# Patient Record
Sex: Female | Born: 1950 | Race: White | Hispanic: No | Marital: Married | Smoking: Never smoker
Health system: Southern US, Community
[De-identification: ages and names within clinical notes are randomized; demographics above are authoritative.]

## PROBLEM LIST (undated history)

## (undated) DIAGNOSIS — Z86010 Personal history of colonic polyps: Secondary | ICD-10-CM

## (undated) DIAGNOSIS — B009 Herpesviral infection, unspecified: Secondary | ICD-10-CM

## (undated) DIAGNOSIS — L570 Actinic keratosis: Secondary | ICD-10-CM

## (undated) DIAGNOSIS — F329 Major depressive disorder, single episode, unspecified: Secondary | ICD-10-CM

## (undated) DIAGNOSIS — M8580 Other specified disorders of bone density and structure, unspecified site: Secondary | ICD-10-CM

## (undated) HISTORY — PX: TUBAL LIGATION: SHX77

## (undated) HISTORY — DX: Herpesviral infection, unspecified: B00.9

## (undated) HISTORY — PX: NECK SURGERY: SHX720

## (undated) HISTORY — DX: Other specified disorders of bone density and structure, unspecified site: M85.80

## (undated) HISTORY — PX: TONSILLECTOMY AND ADENOIDECTOMY: SUR1326

## (undated) HISTORY — DX: Major depressive disorder, single episode, unspecified: F32.9

## (undated) HISTORY — DX: Personal history of colonic polyps: Z86.010

## (undated) HISTORY — PX: ECTOPIC PREGNANCY SURGERY: SHX613

## (undated) HISTORY — DX: Actinic keratosis: L57.0

---

## 1998-02-18 ENCOUNTER — Ambulatory Visit (HOSPITAL_COMMUNITY): Admission: RE | Admit: 1998-02-18 | Discharge: 1998-02-18 | Payer: Self-pay | Admitting: *Deleted

## 1998-11-10 ENCOUNTER — Other Ambulatory Visit: Admission: RE | Admit: 1998-11-10 | Discharge: 1998-11-10 | Payer: Self-pay | Admitting: Gynecology

## 1999-05-17 ENCOUNTER — Ambulatory Visit (HOSPITAL_COMMUNITY): Admission: RE | Admit: 1999-05-17 | Discharge: 1999-05-17 | Payer: Self-pay | Admitting: Gynecology

## 1999-05-17 ENCOUNTER — Encounter: Payer: Self-pay | Admitting: Gynecology

## 1999-07-15 ENCOUNTER — Ambulatory Visit (HOSPITAL_BASED_OUTPATIENT_CLINIC_OR_DEPARTMENT_OTHER): Admission: RE | Admit: 1999-07-15 | Discharge: 1999-07-15 | Payer: Self-pay | Admitting: Plastic Surgery

## 1999-08-05 ENCOUNTER — Ambulatory Visit (HOSPITAL_BASED_OUTPATIENT_CLINIC_OR_DEPARTMENT_OTHER): Admission: RE | Admit: 1999-08-05 | Discharge: 1999-08-05 | Payer: Self-pay | Admitting: Plastic Surgery

## 1999-11-29 ENCOUNTER — Other Ambulatory Visit: Admission: RE | Admit: 1999-11-29 | Discharge: 1999-11-29 | Payer: Self-pay | Admitting: Gynecology

## 2000-07-31 ENCOUNTER — Ambulatory Visit (HOSPITAL_COMMUNITY): Admission: RE | Admit: 2000-07-31 | Discharge: 2000-07-31 | Payer: Self-pay | Admitting: Gynecology

## 2000-07-31 ENCOUNTER — Encounter: Payer: Self-pay | Admitting: Gynecology

## 2001-06-14 ENCOUNTER — Other Ambulatory Visit: Admission: RE | Admit: 2001-06-14 | Discharge: 2001-06-14 | Payer: Self-pay | Admitting: Gynecology

## 2002-04-29 ENCOUNTER — Ambulatory Visit (HOSPITAL_COMMUNITY): Admission: RE | Admit: 2002-04-29 | Discharge: 2002-04-29 | Payer: Self-pay | Admitting: Gynecology

## 2002-04-29 ENCOUNTER — Encounter: Payer: Self-pay | Admitting: Gynecology

## 2002-06-27 ENCOUNTER — Other Ambulatory Visit: Admission: RE | Admit: 2002-06-27 | Discharge: 2002-06-27 | Payer: Self-pay | Admitting: Gynecology

## 2003-08-25 ENCOUNTER — Ambulatory Visit (HOSPITAL_COMMUNITY): Admission: RE | Admit: 2003-08-25 | Discharge: 2003-08-25 | Payer: Self-pay | Admitting: Gynecology

## 2003-09-07 ENCOUNTER — Other Ambulatory Visit: Admission: RE | Admit: 2003-09-07 | Discharge: 2003-09-07 | Payer: Self-pay | Admitting: Gynecology

## 2004-09-09 ENCOUNTER — Other Ambulatory Visit: Admission: RE | Admit: 2004-09-09 | Discharge: 2004-09-09 | Payer: Self-pay | Admitting: Gynecology

## 2004-11-04 ENCOUNTER — Ambulatory Visit (HOSPITAL_COMMUNITY): Admission: RE | Admit: 2004-11-04 | Discharge: 2004-11-04 | Payer: Self-pay | Admitting: Gynecology

## 2004-11-11 ENCOUNTER — Encounter: Admission: RE | Admit: 2004-11-11 | Discharge: 2004-11-11 | Payer: Self-pay | Admitting: Gynecology

## 2005-01-31 ENCOUNTER — Ambulatory Visit: Payer: Self-pay | Admitting: Internal Medicine

## 2005-04-20 ENCOUNTER — Ambulatory Visit: Payer: Self-pay | Admitting: Internal Medicine

## 2005-05-01 ENCOUNTER — Ambulatory Visit: Payer: Self-pay | Admitting: Internal Medicine

## 2005-05-01 LAB — PULMONARY FUNCTION TEST

## 2005-08-03 ENCOUNTER — Ambulatory Visit: Payer: Self-pay | Admitting: Internal Medicine

## 2005-08-09 ENCOUNTER — Ambulatory Visit: Payer: Self-pay | Admitting: Internal Medicine

## 2005-09-11 ENCOUNTER — Other Ambulatory Visit: Admission: RE | Admit: 2005-09-11 | Discharge: 2005-09-11 | Payer: Self-pay | Admitting: Gynecology

## 2005-11-17 ENCOUNTER — Ambulatory Visit (HOSPITAL_COMMUNITY): Admission: RE | Admit: 2005-11-17 | Discharge: 2005-11-17 | Payer: Self-pay | Admitting: Internal Medicine

## 2006-10-10 ENCOUNTER — Other Ambulatory Visit: Admission: RE | Admit: 2006-10-10 | Discharge: 2006-10-10 | Payer: Self-pay | Admitting: Gynecology

## 2007-01-03 ENCOUNTER — Ambulatory Visit (HOSPITAL_COMMUNITY): Admission: RE | Admit: 2007-01-03 | Discharge: 2007-01-03 | Payer: Self-pay | Admitting: Gynecology

## 2007-02-20 ENCOUNTER — Ambulatory Visit: Payer: Self-pay | Admitting: Internal Medicine

## 2007-06-27 DIAGNOSIS — Z860100 Personal history of colon polyps, unspecified: Secondary | ICD-10-CM

## 2007-06-27 HISTORY — DX: Personal history of colonic polyps: Z86.010

## 2007-06-27 HISTORY — DX: Personal history of colon polyps, unspecified: Z86.0100

## 2007-10-17 ENCOUNTER — Other Ambulatory Visit: Admission: RE | Admit: 2007-10-17 | Discharge: 2007-10-17 | Payer: Self-pay | Admitting: Gynecology

## 2008-06-17 ENCOUNTER — Ambulatory Visit (HOSPITAL_COMMUNITY): Admission: RE | Admit: 2008-06-17 | Discharge: 2008-06-17 | Payer: Self-pay | Admitting: Gynecology

## 2008-11-17 ENCOUNTER — Other Ambulatory Visit: Admission: RE | Admit: 2008-11-17 | Discharge: 2008-11-17 | Payer: Self-pay | Admitting: Gynecology

## 2008-11-17 ENCOUNTER — Ambulatory Visit: Payer: Self-pay | Admitting: Gynecology

## 2008-11-17 ENCOUNTER — Encounter: Payer: Self-pay | Admitting: Gynecology

## 2008-12-01 ENCOUNTER — Ambulatory Visit: Payer: Self-pay | Admitting: Gynecology

## 2010-01-21 ENCOUNTER — Encounter: Admission: RE | Admit: 2010-01-21 | Discharge: 2010-01-21 | Payer: Self-pay | Admitting: Gynecology

## 2010-01-25 ENCOUNTER — Ambulatory Visit: Payer: Self-pay | Admitting: Gynecology

## 2010-01-25 ENCOUNTER — Other Ambulatory Visit: Admission: RE | Admit: 2010-01-25 | Discharge: 2010-01-25 | Payer: Self-pay | Admitting: Gynecology

## 2010-10-16 ENCOUNTER — Encounter: Payer: Self-pay | Admitting: Gynecology

## 2011-06-26 ENCOUNTER — Other Ambulatory Visit: Payer: Self-pay | Admitting: Gynecology

## 2011-06-26 DIAGNOSIS — Z1231 Encounter for screening mammogram for malignant neoplasm of breast: Secondary | ICD-10-CM

## 2011-07-11 ENCOUNTER — Ambulatory Visit
Admission: RE | Admit: 2011-07-11 | Discharge: 2011-07-11 | Disposition: A | Discharge status: Home / Self Care | Payer: BC Managed Care – PPO | Source: Ambulatory Visit | Attending: Gynecology | Admitting: Gynecology

## 2011-07-11 DIAGNOSIS — Z1231 Encounter for screening mammogram for malignant neoplasm of breast: Secondary | ICD-10-CM

## 2011-07-13 ENCOUNTER — Encounter: Payer: Self-pay | Admitting: Gynecology

## 2011-07-13 DIAGNOSIS — M8580 Other specified disorders of bone density and structure, unspecified site: Secondary | ICD-10-CM | POA: Insufficient documentation

## 2011-07-17 ENCOUNTER — Encounter: Payer: Self-pay | Admitting: Gynecology

## 2011-07-17 ENCOUNTER — Ambulatory Visit (INDEPENDENT_AMBULATORY_CARE_PROVIDER_SITE_OTHER): Payer: BC Managed Care – PPO | Admitting: Gynecology

## 2011-07-17 VITALS — BP 120/64 | Ht 63.0 in | Wt 151.0 lb

## 2011-07-17 DIAGNOSIS — Z131 Encounter for screening for diabetes mellitus: Secondary | ICD-10-CM

## 2011-07-17 DIAGNOSIS — M899 Disorder of bone, unspecified: Secondary | ICD-10-CM

## 2011-07-17 DIAGNOSIS — Z01419 Encounter for gynecological examination (general) (routine) without abnormal findings: Secondary | ICD-10-CM

## 2011-07-17 DIAGNOSIS — M8580 Other specified disorders of bone density and structure, unspecified site: Secondary | ICD-10-CM

## 2011-07-17 DIAGNOSIS — Z79890 Hormone replacement therapy (postmenopausal): Secondary | ICD-10-CM

## 2011-07-17 DIAGNOSIS — Z13220 Encounter for screening for lipoid disorders: Secondary | ICD-10-CM

## 2011-07-17 DIAGNOSIS — R82998 Other abnormal findings in urine: Secondary | ICD-10-CM

## 2011-07-17 MED ORDER — ESTRADIOL-NORETHINDRONE ACET 0.05-0.14 MG/DAY TD PTTW
1.0000 | MEDICATED_PATCH | TRANSDERMAL | Status: DC
Start: 2011-07-17 — End: 2013-09-08

## 2011-07-17 NOTE — Progress Notes (Signed)
Abigail Austin 28-Aug-1951 [id number]        60 y.o.  for annual exam.  Overall doing well. Had been on HRT of Elestrin pump and Prometrium 100 mg, she ran out about 6 months ago and stopped it but notes hot flushes and just not feeling as well.  Past medical history,surgical history, medications, allergies, family history and social history were all reviewed and documented in the EPIC chart. ROS:  Was performed and pertinent positives and negatives are included in the history.  Exam: chaperone present Filed Vitals:   07/17/11 1417  BP: 120/64   General appearance  Normal Skin grossly normal Head/Neck normal with no cervical or supraclavicular adenopathy thyroid normal Lungs  clear Cardiac RR, without RMG Abdominal  soft, nontender, without masses, organomegaly or hernia Breasts  examined lying and sitting without masses, retractions, discharge or axillary adenopathy. Pelvic  Ext/BUS/vagina  normal   Cervix  normal    Uterus  anteverted, normal size, shape and contour, midline and mobile nontender   Adnexa  Without masses or tenderness    Anus and perineum  normal   Rectovaginal  normal sphincter tone without palpated masses or tenderness.    Assessment/Plan:  60 y.o. female for annual exam.    1. HRT. I reviewed the issues of HRT risks benefits, WHI study, ACOG and NAMS recommendations for lowest dose for shortest period of time. Risk of stroke heart attack DVT as well as rest risk for breast cancer was reviewed. 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 She wants to try something different. I discussed the transdermal advantage as far as decreased thrombosis risk and prescribed CombiPatch plan L5-0.14 times a year give her 2 week sample. Assuming she does well with this then she'll continue her for any issue she will call me. 2. Osteopenia DEXA March 2010 showed a T score of -1.5. She's due for repeat now will schedule  this and she'll follow up for this. 3. Health maintenance. Self breast exams on a monthly basis discussed urged. Just had her mammography this month we'll continue with annual mammography. Colonoscopy 5 years ago. Baseline CBC glucose lipid profile urinalysis and vitamin D level ordered. Assuming she continues well from a gynecologic standpoint and she'll see me in a year sooner as needed.    Dara Lords MD, 3:24 PM 07/17/2011

## 2011-08-08 ENCOUNTER — Ambulatory Visit (INDEPENDENT_AMBULATORY_CARE_PROVIDER_SITE_OTHER): Payer: BC Managed Care – PPO

## 2011-08-08 DIAGNOSIS — M8580 Other specified disorders of bone density and structure, unspecified site: Secondary | ICD-10-CM

## 2011-08-08 DIAGNOSIS — M899 Disorder of bone, unspecified: Secondary | ICD-10-CM

## 2011-08-09 ENCOUNTER — Encounter: Payer: Self-pay | Admitting: Gynecology

## 2012-01-02 ENCOUNTER — Other Ambulatory Visit: Payer: Self-pay | Admitting: *Deleted

## 2012-01-02 DIAGNOSIS — E559 Vitamin D deficiency, unspecified: Secondary | ICD-10-CM

## 2012-09-25 LAB — HM PAP SMEAR

## 2012-09-25 LAB — HM COLONOSCOPY

## 2012-09-25 LAB — HM MAMMOGRAPHY

## 2012-11-27 ENCOUNTER — Other Ambulatory Visit: Payer: Self-pay | Admitting: Gynecology

## 2012-11-27 DIAGNOSIS — Z1231 Encounter for screening mammogram for malignant neoplasm of breast: Secondary | ICD-10-CM

## 2012-12-06 ENCOUNTER — Ambulatory Visit (HOSPITAL_COMMUNITY)
Admission: RE | Admit: 2012-12-06 | Discharge: 2012-12-06 | Disposition: A | Discharge status: Home / Self Care | Payer: BC Managed Care – PPO | Source: Ambulatory Visit | Attending: Gynecology | Admitting: Gynecology

## 2012-12-06 DIAGNOSIS — Z1231 Encounter for screening mammogram for malignant neoplasm of breast: Secondary | ICD-10-CM | POA: Insufficient documentation

## 2013-09-08 ENCOUNTER — Ambulatory Visit (INDEPENDENT_AMBULATORY_CARE_PROVIDER_SITE_OTHER): Payer: BC Managed Care – PPO | Admitting: Gynecology

## 2013-09-08 ENCOUNTER — Other Ambulatory Visit (HOSPITAL_COMMUNITY)
Admission: RE | Admit: 2013-09-08 | Discharge: 2013-09-08 | Disposition: A | Discharge status: Home / Self Care | Payer: BC Managed Care – PPO | Source: Ambulatory Visit | Attending: Gynecology | Admitting: Gynecology

## 2013-09-08 ENCOUNTER — Encounter: Payer: Self-pay | Admitting: Gynecology

## 2013-09-08 VITALS — BP 106/70 | Ht 63.0 in | Wt 157.0 lb

## 2013-09-08 DIAGNOSIS — Z01419 Encounter for gynecological examination (general) (routine) without abnormal findings: Secondary | ICD-10-CM

## 2013-09-08 DIAGNOSIS — Z79890 Hormone replacement therapy (postmenopausal): Secondary | ICD-10-CM

## 2013-09-08 DIAGNOSIS — M899 Disorder of bone, unspecified: Secondary | ICD-10-CM

## 2013-09-08 DIAGNOSIS — M8580 Other specified disorders of bone density and structure, unspecified site: Secondary | ICD-10-CM

## 2013-09-08 DIAGNOSIS — N951 Menopausal and female climacteric states: Secondary | ICD-10-CM

## 2013-09-08 MED ORDER — ESTRADIOL-NORETHINDRONE ACET 0.05-0.14 MG/DAY TD PTTW: 1.0000 | MEDICATED_PATCH | TRANSDERMAL | Status: DC

## 2013-09-08 NOTE — Addendum Note (Signed)
Addended by: Richardson Chiquito on: 09/08/2013 10:53 AM   Modules accepted: Orders

## 2013-09-08 NOTE — Progress Notes (Signed)
Abigail Austin 01-01-1951 [id number]        62 y.o.  [id number] for annual exam.  Several issues below.  Past medical history,surgical history, problem list, medications, allergies, family history and social history were all reviewed and documented in the EPIC chart.  ROS:  Performed and pertinent positives and negatives are included in the history, assessment and plan .  Exam: Sherrilyn Rist assistant Filed Vitals:   09/08/13 0948  BP: 106/70  Height: 5\' 3"  (1.6 m)  Weight: 157 lb (71.215 kg)   General appearance  Normal Skin grossly normal Head/Neck normal with no cervical or supraclavicular adenopathy thyroid normal Lungs  clear Cardiac RR, without RMG Abdominal  soft, nontender, without masses, organomegaly or hernia Breasts  examined lying and sitting without masses, retractions, discharge or axillary adenopathy. Pelvic  Ext/BUS/vagina  Normal with mild atrophic changes  Cervix  Normal with mild atrophic changes  Uterus  anteverted, normal size, shape and contour, midline and mobile nontender   Adnexa  Without masses or tenderness    Anus and perineum  Normal   Rectovaginal  Normal sphincter tone without palpated masses or tenderness.    Assessment/Plan:  62 y.o. [id number] female for annual exam.   1. Postmenopausal/menopausal symptoms. Patient having hot flushes, night sweats and just not feeling well since stopping her HRT. She was begun on CombiPatch last year but then discontinued this following her son's suicide due to the hectic time. Would like to reinitiate HRT. Reviewed risks/benefits again to include stroke heart attack DVT and breast cancer. CombiPatch 0.05/0.14 prescribed. Patient will call if she has any vaginal bleeding or if her symptoms are not well controlled and will go to the next higher level. 2. Osteopenia. DEXA 2012 T score -1.9 FRAX 9%/1%. Repeat DEXA now a 2 year interval. Increase calcium vitamin D reviewed. 3. Pap smear 2011. Pap/HPV today. No history of  significant abnormal Pap smears previously. 4. Mammography 11/2012. Continue with annual mammography. SBE monthly reviewed. 5. HSV. Occasional HSV which she uses acyclovir for all to has not had an outbreak in a long time. Has a supply of acyclovir at home. She will call if she needs more. 6. Colonoscopy 2014. Repeat at their recommended interval. 7. Health maintenance. No blood work done as she plans an appointment to see Dr. Cato Mulligan for an annual exam. Followup for bone density otherwise annually sooner if any issues once reinitiating HRT.   Note: This document was prepared with digital dictation and possible smart phrase technology. Any transcriptional errors that result from this process are unintentional.   Dara Lords MD, 10:30 AM 09/08/2013

## 2013-09-08 NOTE — Patient Instructions (Signed)
Followup for bone density as scheduled. Followup if any issues once reinitiating the hormone replacement treatment. Call me if you do any bleeding. Followup in one year for annual GYN exam.

## 2013-09-09 LAB — URINALYSIS W MICROSCOPIC + REFLEX CULTURE
Bilirubin Urine: NEGATIVE
Crystals: NONE SEEN
Glucose, UA: NEGATIVE mg/dL
Hgb urine dipstick: NEGATIVE
Ketones, ur: NEGATIVE mg/dL
Nitrite: NEGATIVE
Protein, ur: NEGATIVE mg/dL
Squamous Epithelial / LPF: NONE SEEN
pH: 7 (ref 5.0–8.0)

## 2013-09-11 ENCOUNTER — Other Ambulatory Visit: Payer: Self-pay | Admitting: Gynecology

## 2013-09-11 MED ORDER — AMPICILLIN 500 MG PO CAPS: 500.0000 mg | ORAL_CAPSULE | Freq: Four times a day (QID) | ORAL | Status: DC

## 2013-10-17 ENCOUNTER — Encounter: Payer: Self-pay | Admitting: Internal Medicine

## 2013-10-17 ENCOUNTER — Ambulatory Visit (INDEPENDENT_AMBULATORY_CARE_PROVIDER_SITE_OTHER): Payer: BC Managed Care – PPO | Admitting: Internal Medicine

## 2013-10-17 VITALS — BP 112/74 | HR 68 | Temp 98.4°F | Ht 63.0 in | Wt 160.0 lb

## 2013-10-17 DIAGNOSIS — Z2911 Encounter for prophylactic immunotherapy for respiratory syncytial virus (RSV): Secondary | ICD-10-CM

## 2013-10-17 DIAGNOSIS — Z23 Encounter for immunization: Secondary | ICD-10-CM

## 2013-10-17 DIAGNOSIS — Z0000 Encounter for general adult medical examination without abnormal findings: Secondary | ICD-10-CM

## 2013-10-17 LAB — BASIC METABOLIC PANEL
BUN: 16 mg/dL (ref 6–23)
CHLORIDE: 106 meq/L (ref 96–112)
CO2: 28 mEq/L (ref 19–32)
CREATININE: 1.1 mg/dL (ref 0.4–1.2)
Calcium: 9.4 mg/dL (ref 8.4–10.5)
GFR: 56.46 mL/min — ABNORMAL LOW (ref >60.00)
Glucose, Bld: 94 mg/dL (ref 70–99)
POTASSIUM: 4.2 meq/L (ref 3.5–5.1)
Sodium: 140 mEq/L (ref 135–145)

## 2013-10-17 LAB — POCT URINALYSIS DIPSTICK
BILIRUBIN UA: NEGATIVE
Glucose, UA: NEGATIVE
Ketones, UA: NEGATIVE
Leukocytes, UA: NEGATIVE
NITRITE UA: NEGATIVE
PH UA: 7
Protein, UA: NEGATIVE
RBC UA: NEGATIVE
Spec Grav, UA: 1.015
Urobilinogen, UA: 0.2

## 2013-10-17 LAB — LIPID PANEL
CHOL/HDL RATIO: 3
CHOLESTEROL: 222 mg/dL — AB (ref 0–200)
HDL: 74.6 mg/dL (ref >39.00)
Triglycerides: 86 mg/dL (ref 0.0–149.0)
VLDL: 17.2 mg/dL (ref 0.0–40.0)

## 2013-10-17 LAB — CBC WITH DIFFERENTIAL/PLATELET
Basophils Absolute: 0 10*3/uL (ref 0.0–0.1)
Basophils Relative: 0.6 % (ref 0.0–3.0)
EOS PCT: 1.5 % (ref 0.0–5.0)
Eosinophils Absolute: 0.1 10*3/uL (ref 0.0–0.7)
HCT: 40.5 % (ref 36.0–46.0)
Hemoglobin: 13.5 g/dL (ref 12.0–15.0)
Lymphocytes Relative: 28.8 % (ref 12.0–46.0)
Lymphs Abs: 2.2 10*3/uL (ref 0.7–4.0)
MCHC: 33.3 g/dL (ref 30.0–36.0)
MCV: 89.5 fl (ref 78.0–100.0)
MONO ABS: 0.5 10*3/uL (ref 0.1–1.0)
MONOS PCT: 6.3 % (ref 3.0–12.0)
Neutro Abs: 4.9 10*3/uL (ref 1.4–7.7)
Neutrophils Relative %: 62.8 % (ref 43.0–77.0)
PLATELETS: 283 10*3/uL (ref 150.0–400.0)
RBC: 4.53 Mil/uL (ref 3.87–5.11)
RDW: 14.9 % — ABNORMAL HIGH (ref 11.5–14.6)
WBC: 7.8 10*3/uL (ref 4.5–10.5)

## 2013-10-17 LAB — HEPATIC FUNCTION PANEL
ALT: 24 U/L (ref 0–35)
AST: 25 U/L (ref 0–37)
Albumin: 4.6 g/dL (ref 3.5–5.2)
Alkaline Phosphatase: 63 U/L (ref 39–117)
BILIRUBIN DIRECT: 0 mg/dL (ref 0.0–0.3)
BILIRUBIN TOTAL: 0.5 mg/dL (ref 0.3–1.2)
Total Protein: 7.1 g/dL (ref 6.0–8.3)

## 2013-10-17 LAB — LDL CHOLESTEROL, DIRECT: Direct LDL: 133.4 mg/dL

## 2013-10-17 LAB — TSH: TSH: 0.93 u[IU]/mL (ref 0.35–5.50)

## 2013-10-17 NOTE — Progress Notes (Signed)
Pre visit review using our clinic review tool, if applicable. No additional management support is needed unless otherwise documented below in the visit note. 

## 2013-10-17 NOTE — Progress Notes (Signed)
Wellness exam  Discussed- grief. She has lost son to suicide  Spent 6735 mintues discussing with patinet She is getting counselling She is exercising.  Well visit  Check labs Update - immunizations

## 2013-10-31 ENCOUNTER — Encounter: Payer: Self-pay | Admitting: Gynecology

## 2013-11-17 ENCOUNTER — Telehealth: Payer: Self-pay | Admitting: *Deleted

## 2013-11-17 NOTE — Telephone Encounter (Signed)
Pt called and said her CombiPatch 0.05/0.14 is not covered with insurance. Pt said Prempro and vivelle dot or estrogen pill. Please advise

## 2013-11-18 MED ORDER — PROGESTERONE MICRONIZED 100 MG PO CAPS: ORAL_CAPSULE | ORAL | Status: DC

## 2013-11-18 MED ORDER — ESTRADIOL 0.05 MG/24HR TD PTTW: 1.0000 | MEDICATED_PATCH | TRANSDERMAL | Status: DC

## 2013-11-18 NOTE — Telephone Encounter (Signed)
Pt informed with the below note, rx sent. 

## 2013-11-18 NOTE — Telephone Encounter (Signed)
Would recommended Vivelle-Dot 0.05 patch and Prometrium 100 mg nightly, refill through December 2015

## 2013-12-19 ENCOUNTER — Other Ambulatory Visit: Payer: Self-pay

## 2013-12-19 MED ORDER — ACYCLOVIR 200 MG PO CAPS: 200.0000 mg | ORAL_CAPSULE | Freq: Every day | ORAL | Status: DC | PRN

## 2014-07-27 ENCOUNTER — Encounter: Payer: Self-pay | Admitting: Internal Medicine

## 2014-09-25 DIAGNOSIS — M8580 Other specified disorders of bone density and structure, unspecified site: Secondary | ICD-10-CM

## 2014-09-25 HISTORY — DX: Other specified disorders of bone density and structure, unspecified site: M85.80

## 2014-11-23 ENCOUNTER — Other Ambulatory Visit: Payer: Self-pay | Admitting: Plastic Surgery

## 2014-12-29 ENCOUNTER — Telehealth: Payer: Self-pay | Admitting: Internal Medicine

## 2014-12-29 NOTE — Telephone Encounter (Signed)
lmovm to call back and reschedule. Can be worked in per DR RaytheonHunter

## 2014-12-30 ENCOUNTER — Ambulatory Visit: Payer: BC Managed Care – PPO | Admitting: Family Medicine

## 2015-01-12 ENCOUNTER — Encounter: Payer: Self-pay | Admitting: Family Medicine

## 2015-01-12 ENCOUNTER — Ambulatory Visit (INDEPENDENT_AMBULATORY_CARE_PROVIDER_SITE_OTHER): Payer: BLUE CROSS/BLUE SHIELD | Admitting: Family Medicine

## 2015-01-12 DIAGNOSIS — L570 Actinic keratosis: Secondary | ICD-10-CM

## 2015-01-12 DIAGNOSIS — F32A Depression, unspecified: Secondary | ICD-10-CM | POA: Insufficient documentation

## 2015-01-12 DIAGNOSIS — F329 Major depressive disorder, single episode, unspecified: Secondary | ICD-10-CM | POA: Diagnosis not present

## 2015-01-12 DIAGNOSIS — E785 Hyperlipidemia, unspecified: Secondary | ICD-10-CM | POA: Diagnosis not present

## 2015-01-12 DIAGNOSIS — Z79890 Hormone replacement therapy (postmenopausal): Secondary | ICD-10-CM | POA: Insufficient documentation

## 2015-01-12 DIAGNOSIS — B009 Herpesviral infection, unspecified: Secondary | ICD-10-CM | POA: Insufficient documentation

## 2015-01-12 HISTORY — DX: Depression, unspecified: F32.A

## 2015-01-12 HISTORY — DX: Actinic keratosis: L57.0

## 2015-01-12 MED ORDER — SERTRALINE HCL 50 MG PO TABS: 50.0000 mg | ORAL_TABLET | Freq: Every day | ORAL | Status: DC

## 2015-01-12 NOTE — Progress Notes (Signed)
Abigail ConchStephen Cordney Barstow, MD Phone: [phone]  Subjective:  Patient presents today to establish care with me as their new primary care provider. Patient was formerly a patient of Dr. Cato MulliganSwords. Chief complaint-noted.   See problem oriented charting ROS- No SI/HI. No anxiety. Productive cough for 2 months without fever, chills, unintentional weight loss, night sweats, shortness of breath (agrees to follow up if persists). No chest pain or palpitations.   The following were reviewed and entered/updated in epic: Past Medical History  Diagnosis Date  . Osteopenia 2012    T score -1.9 FRAX 9%/1%  . HSV (herpes simplex virus) infection   . Depression 01/12/2015    zoloft 50mg    . Hyperlipidemia 01/12/2015    Niaspan. Based off 2015 lipids 10 year risk 3.1%. Family history Miin father in mid 4560s, brother in mid 460s. Mother in late 2270s.  Lab Results  Component Value Date   CHOL 222* 10/17/2013   HDL 74.60 10/17/2013   LDLDIRECT 133.4 10/17/2013   TRIG 86.0 10/17/2013   CHOLHDL 3 10/17/2013      . History of colonic polyps 06/27/2007    Adenoma around 2010. Normal colonoscopy 2015 under Dr. Ewing SchleinMagod.    Marland Kitchen. Actinic keratosis 01/12/2015    Dermatology    Patient Active Problem List   Diagnosis Date Noted  . Depression 01/12/2015    Priority: Medium  . Hyperlipidemia 01/12/2015    Priority: Medium  . Actinic keratosis 01/12/2015    Priority: Low  . Herpes simplex 01/12/2015    Priority: Low  . Hormone replacement therapy 01/12/2015    Priority: Low  . Osteopenia     Priority: Low  . History of colonic polyps 06/27/2007    Priority: Low   Past Surgical History  Procedure Laterality Date  . Cesarean section      X 3  . Tubal ligation    . Ectopic pregnancy surgery    . Tonsillectomy and adenoidectomy      Family History  Problem Relation Age of Onset  . Heart disease Mother     late 5370s  . Hypertension Mother   . Heart disease Father     mid 2660s  . Cancer Father     Retro peritoneal  sarcoma  . Diabetes Maternal Grandmother   . Heart disease Brother     MI 1865  . Other Son     suicide  . CVA Maternal Grandmother     berry aneurysm  . Dementia Mother     Medications- reviewed and updated Current Outpatient Prescriptions  Medication Sig Dispense Refill  . Cholecalciferol (VITAMIN D PO) Take 5,000 Units by mouth.      . estradiol (VIVELLE-DOT) 0.05 MG/24HR patch Place 1 patch (0.05 mg total) onto the skin 2 (two) times a week. 8 patch 10  . glucosamine-chondroitin 500-400 MG tablet Take 1 tablet by mouth daily.      . Multiple Vitamin (MULTIVITAMIN) tablet Take 1 tablet by mouth daily.      . niacin (SLO-NIACIN) 500 MG tablet Take 500 mg by mouth at bedtime.    . progesterone (PROMETRIUM) 100 MG capsule Take 1 tablet by mouth daily at bedtime 30 capsule 10  . acyclovir (ZOVIRAX) 200 MG capsule Take 1 capsule (200 mg total) by mouth daily as needed. (Patient not taking: Reported on 01/12/2015) 180 capsule 2  . Calcium Carbonate-Vitamin D (CALCIUM + D PO) Take 1 tablet by mouth every 3 (three) days.      No current facility-administered  medications for this visit.   Allergies-reviewed and updated No Known Allergies  History   Social History  . Marital Status: Married    Spouse Name: N/A  . Number of Children: N/A  . Years of Education: N/A   Social History Main Topics  . Smoking status: Never Smoker   . Smokeless tobacco: Never Used  . Alcohol Use: Yes     Comment: rare  . Drug Use: Not on file  . Sexual Activity: Yes    Birth Control/ Protection: Surgical   Other Topics Concern  . None   Social History Narrative   Married (husband Jesusita Oka is anesthesiologist in [employer]), 2 living children, lost one child at age 30. Daughter 88 going to [place], son at [place] age 70 in 2016.       Retired Administrator, arts in 2006.       Hobbies: Riding horses and cares for horses, equine assisted learning, therapy through horses, calligraphy, lakehouse at  [employer]   Objective: BP 110/72 mmHg  Pulse 70  Temp(Src) 98.6 F (37 C)  Wt 166 lb (75.297 kg) Gen: NAD, resting comfortably Psych: depressed mood, still talkative   Assessment/Plan:  Depression S: symptoms for 2 years since loss of son. PHQ9 of 7. Year 1 was obviously difficult but year 2 harder than expected. Symptoms somewhat improving. Endorses anhedonia and depressed mood. No treatments tried other than continuing to ride horses.  A/P: PHQ9 of 7. No SI/HI. Wants to trial SSRI and think this is reasonable. Zoloft  rx with 2 week follow up phq9 by mychart and office visit in 6 weeks. Consider titration to  at 2 weeks.    Hyperlipidemia S:Niaspan. Based off 2015 lipids 10 year risk 3.1%. Family history Miin father in mid 43s, brother in mid 32s. Mother in late 15s.  A/P: we discussed family history is concerning but per lipids risk still low. Discussed statin use  But ultimately we decided to hold off unless risk is at least 5% if not 7.5%. Patient is fully aware of cardiac risks with family history. We also decided to hold off on aspirin wiith new changes to USPTF guidelines and unclear indication at her risk group in 54s.     6 week in person, 2 weeks by mychart  Meds ordered this encounter  Medications  . sertraline (ZOLOFT) 50 MG tablet    Sig: Take 1 tablet (50 mg total) by mouth daily.    Dispense:  30 tablet    Refill:  3    >50% of 35 minute office visit was spent on counseling (related to depression and loss of son as wella s cardiac risk) and coordination of care

## 2015-01-12 NOTE — Patient Instructions (Signed)
Depression  Start Zoloft 50mg   I typically see patients in 10-14 days for reassessment   Or you could send me a mychart message with a phq9 ,   Any side effects, and if you are having any suicidal thoughts    Could consider titration to 100mg  at follow up   Follow up in 6 weeks with me if hold off on 10-14 day follow up    10 year risk based off ascvd calculator 3.1% but family history concerning, we are holding off on statin and aspirin for now.

## 2015-01-12 NOTE — Assessment & Plan Note (Signed)
S:Niaspan. Based off 2015 lipids 10 year risk 3.1%. Family history Miin father in mid 1760s, brother in mid 8460s. Mother in late 9570s.  A/P: we discussed family history is concerning but per lipids risk still low. Discussed statin use  But ultimately we decided to hold off unless risk is at least 5% if not 7.5%. Patient is fully aware of cardiac risks with family history. We also decided to hold off on aspirin wiith new changes to USPTF guidelines and unclear indication at her risk group in 2960s.

## 2015-01-12 NOTE — Assessment & Plan Note (Signed)
S: symptoms for 2 years since loss of son. PHQ9 of 7. Year 1 was obviously difficult but year 2 harder than expected. Symptoms somewhat improving. Endorses anhedonia and depressed mood. No treatments tried other than continuing to ride horses.  A/P: PHQ9 of 7. No SI/HI. Wants to trial SSRI and think this is reasonable. Zoloft 50mg  rx with 2 week follow up phq9 by mychart and office visit in 6 weeks. Consider titration to 100mg  at 2 weeks.

## 2015-02-09 ENCOUNTER — Encounter: Payer: Self-pay | Admitting: Family Medicine

## 2015-02-18 ENCOUNTER — Other Ambulatory Visit: Payer: Self-pay | Admitting: Gynecology

## 2015-04-08 ENCOUNTER — Ambulatory Visit (INDEPENDENT_AMBULATORY_CARE_PROVIDER_SITE_OTHER): Payer: BLUE CROSS/BLUE SHIELD | Admitting: Gynecology

## 2015-04-08 ENCOUNTER — Other Ambulatory Visit: Payer: Self-pay

## 2015-04-08 ENCOUNTER — Encounter: Payer: Self-pay | Admitting: Gynecology

## 2015-04-08 VITALS — BP 122/74 | Ht 62.5 in | Wt 163.0 lb

## 2015-04-08 DIAGNOSIS — Z79890 Hormone replacement therapy (postmenopausal): Secondary | ICD-10-CM

## 2015-04-08 DIAGNOSIS — M8580 Other specified disorders of bone density and structure, unspecified site: Secondary | ICD-10-CM | POA: Diagnosis not present

## 2015-04-08 DIAGNOSIS — N952 Postmenopausal atrophic vaginitis: Secondary | ICD-10-CM | POA: Diagnosis not present

## 2015-04-08 DIAGNOSIS — Z01419 Encounter for gynecological examination (general) (routine) without abnormal findings: Secondary | ICD-10-CM | POA: Diagnosis not present

## 2015-04-08 DIAGNOSIS — A609 Anogenital herpesviral infection, unspecified: Secondary | ICD-10-CM

## 2015-04-08 MED ORDER — PROGESTERONE MICRONIZED 100 MG PO CAPS: 100.0000 mg | ORAL_CAPSULE | Freq: Every day | ORAL | Status: DC

## 2015-04-08 MED ORDER — ESTRADIOL 1 MG PO TABS: 1.0000 mg | ORAL_TABLET | Freq: Every day | ORAL | Status: DC

## 2015-04-08 MED ORDER — FLUOXETINE HCL 20 MG PO CAPS: 20.0000 mg | ORAL_CAPSULE | Freq: Every day | ORAL | Status: DC

## 2015-04-08 MED ORDER — VALACYCLOVIR HCL 500 MG PO TABS: 500.0000 mg | ORAL_TABLET | Freq: Two times a day (BID) | ORAL | Status: DC

## 2015-04-08 NOTE — Progress Notes (Signed)
Abigail Austin 09/06/1951 [id number]        64 y.o.  [id number] for annual exam.  Several issues noted below.  Past medical history,surgical history, problem list, medications, allergies, family history and social history were all reviewed and documented as reviewed in the EPIC chart.  ROS:  Performed with pertinent positives and negatives included in the history, assessment and plan.   Additional significant findings :  none   Exam: [facility] personassistant Filed Vitals:   04/08/15 1003  BP: 122/74  Height: 5' 2.5" (1.588 m)  Weight: 163 lb (73.936 kg)   General appearance:  Normal affect, orientation and appearance. Skin: Grossly normal HEENT: Without gross lesions.  No cervical or supraclavicular adenopathy. Thyroid normal.  Lungs:  Clear without wheezing, rales or rhonchi Cardiac: RR, without RMG Abdominal:  Soft, nontender, without masses, guarding, rebound, organomegaly or hernia Breasts:  Examined lying and sitting without masses, retractions, discharge or axillary adenopathy. Pelvic:  Ext/BUS/vagina normal with mild atrophic changes  Cervix normal  Uterus anteverted, normal size, shape and contour, midline and mobile nontender   Adnexa  Without masses or tenderness    Anus and perineum  Normal   Rectovaginal  Normal sphincter tone without palpated masses or tenderness.    Assessment/Plan:  64 y.o. Abigail Austin female for annual exam.   1. Postmenopausal/atrophic genital changes/HRT.  Patient had been on HRT but discontinued. She is noting worsening hot flashes, night sweats and vaginal dryness.  No vaginal bleeding. She wants to reinitiate HRT. I again reviewed the whole issue and risks of HRT to include the WHI study with increased risks of stroke, heart attack, DVT and breast cancer. Options for oral versus transdermal or transvaginal reviewed. Patient would prefer oral.  Estradiol 1 mg and Prometrium 100 mg prescribed. I gave her both a written prescription for short-term supply and and  a years refill through her mail order pharmacy. Patient will go ahead and start this and assuming she does well we'll continue. If she has any issues or questions or does any vaginal bleeding she knows to call me. 2. Osteopenia.  DEXA 2012 T score -1.9. FRAX 9%/1%. Patient was recommended to have repeat DEXA done last year but never followed up for this. I again asked her to schedule this and she agrees to do so. Increased calcium vitamin D reviewed. 3. Depression.  Previously was on Zoloft but discontinued this. Notes that she is having increasing emotional swings. Asked if I would be comfortable prescribing an antidepressant for her. Lost her son to suicide several years ago. Prozac 20 mg daily prescribed.  Refill 1 year. Side effect and risks reviewed to include suicidal ideation need to report feeling worse once starting the but medicine. Follow up if she does not get an adequate response for dosage adjustment. 4. HSV.  Occasional outbreaks once a month or every other month. Options for daily suppression versus intermittent treatment reviewed. Patient wants to continue with intermittent suppression. Valtrex 500 mg twice a day 3-5 days #90 to her mail order prescribed. 5. Pap smear 2014.  No Pap smear done today. No history of significant abnormal Pap smears previously. Plan repeat Pap smear next year at three-year interval. 6. Mammography 11/2012.  Patient reminded that she is overdue and she agrees to call and schedule. SBE monthly reviewed. 7. Colonoscopy 2014.  Repeat at their recommended interval. 8. Health maintenance.  No routine blood work done as she has this done at her primary physician's office. Follow up in one year,  sooner as needed.   Dara Lords MD, 10:53 AM 04/08/2015

## 2015-04-08 NOTE — Patient Instructions (Addendum)
Follow up for bone density as scheduled.  Schedule your mammogram.  You may obtain a copy of any labs that were done today by logging onto MyChart as outlined in the instructions provided with your AVS (after visit summary). The office will not call with normal lab results but certainly if there are any significant abnormalities then we will contact you.   Health Maintenance, Female A healthy lifestyle and preventative care can promote health and wellness.  Maintain regular health, dental, and eye exams.  Eat a healthy diet. Foods like vegetables, fruits, whole grains, low-fat dairy products, and lean protein foods contain the nutrients you need without too many calories. Decrease your intake of foods high in solid fats, added sugars, and salt. Get information about a proper diet from your caregiver, if necessary.  Regular physical exercise is one of the most important things you can do for your health. Most adults should get at least 150 minutes of moderate-intensity exercise (any activity that increases your heart rate and causes you to sweat) each week. In addition, most adults need muscle-strengthening exercises on 2 or more days a week.   Maintain a healthy weight. The body mass index (BMI) is a screening tool to identify possible weight problems. It provides an estimate of body fat based on height and weight. Your caregiver can help determine your BMI, and can help you achieve or maintain a healthy weight. For adults 20 years and older:  A BMI below 18.5 is considered underweight.  A BMI of 18.5 to 24.9 is normal.  A BMI of 25 to 29.9 is considered overweight.  A BMI of 30 and above is considered obese.  Maintain normal blood lipids and cholesterol by exercising and minimizing your intake of saturated fat. Eat a balanced diet with plenty of fruits and vegetables. Blood tests for lipids and cholesterol should begin at age 79 and be repeated every 5 years. If your lipid or cholesterol  levels are high, you are over 50, or you are a high risk for heart disease, you may need your cholesterol levels checked more frequently.Ongoing high lipid and cholesterol levels should be treated with medicines if diet and exercise are not effective.  If you smoke, find out from your caregiver how to quit. If you do not use tobacco, do not start.  Lung cancer screening is recommended for adults aged 54 80 years who are at high risk for developing lung cancer because of a history of smoking. Yearly low-dose computed tomography (CT) is recommended for people who have at least a 30-pack-year history of smoking and are a current smoker or have quit within the past 15 years. A pack year of smoking is smoking an average of 1 pack of cigarettes a day for 1 year (for example: 1 pack a day for 30 years or 2 packs a day for 15 years). Yearly screening should continue until the smoker has stopped smoking for at least 15 years. Yearly screening should also be stopped for people who develop a health problem that would prevent them from having lung cancer treatment.  If you are pregnant, do not drink alcohol. If you are breastfeeding, be very cautious about drinking alcohol. If you are not pregnant and choose to drink alcohol, do not exceed 1 drink per day. One drink is considered to be 12 ounces (355 mL) of beer, 5 ounces (148 mL) of wine, or 1.5 ounces (44 mL) of liquor.  Avoid use of street drugs. Do not share needles with  anyone. Ask for help if you need support or instructions about stopping the use of drugs.  High blood pressure causes heart disease and increases the risk of stroke. Blood pressure should be checked at least every 1 to 2 years. Ongoing high blood pressure should be treated with medicines, if weight loss and exercise are not effective.  If you are 55 to 63 years old, ask your caregiver if you should take aspirin to prevent strokes.  Diabetes screening involves taking a blood sample to check  your fasting blood sugar level. This should be done once every 3 years, after age 45, if you are within normal weight and without risk factors for diabetes. Testing should be considered at a younger age or be carried out more frequently if you are overweight and have at least 1 risk factor for diabetes.  Breast cancer screening is essential preventative care for women. You should practice "breast self-awareness." This means understanding the normal appearance and feel of your breasts and may include breast self-examination. Any changes detected, no matter how small, should be reported to a caregiver. Women in their 20s and 30s should have a clinical breast exam (CBE) by a caregiver as part of a regular health exam every 1 to 3 years. After age 40, women should have a CBE every year. Starting at age 40, women should consider having a mammogram (breast X-ray) every year. Women who have a family history of breast cancer should talk to their caregiver about genetic screening. Women at a high risk of breast cancer should talk to their caregiver about having an MRI and a mammogram every year.  Breast cancer gene (BRCA)-related cancer risk assessment is recommended for women who have family members with BRCA-related cancers. BRCA-related cancers include breast, ovarian, tubal, and peritoneal cancers. Having family members with these cancers may be associated with an increased risk for harmful changes (mutations) in the breast cancer genes BRCA1 and BRCA2. Results of the assessment will determine the need for genetic counseling and BRCA1 and BRCA2 testing.  The Pap test is a screening test for cervical cancer. Women should have a Pap test starting at age 21. Between ages 21 and 29, Pap tests should be repeated every 2 years. Beginning at age 30, you should have a Pap test every 3 years as long as the past 3 Pap tests have been normal. If you had a hysterectomy for a problem that was not cancer or a condition that  could lead to cancer, then you no longer need Pap tests. If you are between ages 65 and 70, and you have had normal Pap tests going back 10 years, you no longer need Pap tests. If you have had past treatment for cervical cancer or a condition that could lead to cancer, you need Pap tests and screening for cancer for at least 20 years after your treatment. If Pap tests have been discontinued, risk factors (such as a new sexual partner) need to be reassessed to determine if screening should be resumed. Some women have medical problems that increase the chance of getting cervical cancer. In these cases, your caregiver may recommend more frequent screening and Pap tests.  The human papillomavirus (HPV) test is an additional test that may be used for cervical cancer screening. The HPV test looks for the virus that can cause the cell changes on the cervix. The cells collected during the Pap test can be tested for HPV. The HPV test could be used to screen women aged 30   years and older, and should be used in women of any age who have unclear Pap test results. After the age of 30, women should have HPV testing at the same frequency as a Pap test.  Colorectal cancer can be detected and often prevented. Most routine colorectal cancer screening begins at the age of 50 and continues through age 75. However, your caregiver may recommend screening at an earlier age if you have risk factors for colon cancer. On a yearly basis, your caregiver may provide home test kits to check for hidden blood in the stool. Use of a small camera at the end of a tube, to directly examine the colon (sigmoidoscopy or colonoscopy), can detect the earliest forms of colorectal cancer. Talk to your caregiver about this at age 50, when routine screening begins. Direct examination of the colon should be repeated every 5 to 10 years through age 75, unless early forms of pre-cancerous polyps or small growths are found.  Hepatitis C blood testing is  recommended for all people born from 1945 through 1965 and any individual with known risks for hepatitis C.  Practice safe sex. Use condoms and avoid high-risk sexual practices to reduce the spread of sexually transmitted infections (STIs). Sexually active women aged 25 and younger should be checked for Chlamydia, which is a common sexually transmitted infection. Older women with new or multiple partners should also be tested for Chlamydia. Testing for other STIs is recommended if you are sexually active and at increased risk.  Osteoporosis is a disease in which the bones lose minerals and strength with aging. This can result in serious bone fractures. The risk of osteoporosis can be identified using a bone density scan. Women ages 65 and over and women at risk for fractures or osteoporosis should discuss screening with their caregivers. Ask your caregiver whether you should be taking a calcium supplement or vitamin D to reduce the rate of osteoporosis.  Menopause can be associated with physical symptoms and risks. Hormone replacement therapy is available to decrease symptoms and risks. You should talk to your caregiver about whether hormone replacement therapy is right for you.  Use sunscreen. Apply sunscreen liberally and repeatedly throughout the day. You should seek shade when your shadow is shorter than you. Protect yourself by wearing long sleeves, pants, a wide-brimmed hat, and sunglasses year round, whenever you are outdoors.  Notify your caregiver of new moles or changes in moles, especially if there is a change in shape or color. Also notify your caregiver if a mole is larger than the size of a pencil eraser.  Stay current with your immunizations. Document Released: 03/27/2011 Document Revised: 01/06/2013 Document Reviewed: 03/27/2011 [employer] Patient Information 2014 [employer].   

## 2015-04-09 LAB — URINALYSIS W MICROSCOPIC + REFLEX CULTURE
BILIRUBIN URINE: NEGATIVE
Bacteria, UA: NONE SEEN
CRYSTALS: NONE SEEN
Casts: NONE SEEN
GLUCOSE, UA: NEGATIVE mg/dL
HGB URINE DIPSTICK: NEGATIVE
Ketones, ur: NEGATIVE mg/dL
LEUKOCYTES UA: NEGATIVE
Nitrite: NEGATIVE
PROTEIN: NEGATIVE mg/dL
SQUAMOUS EPITHELIAL / LPF: NONE SEEN
Specific Gravity, Urine: 1.019 (ref 1.005–1.030)
UROBILINOGEN UA: 0.2 mg/dL (ref 0.0–1.0)
pH: 6.5 (ref 5.0–8.0)

## 2015-04-14 ENCOUNTER — Other Ambulatory Visit: Payer: Self-pay | Admitting: Gynecology

## 2015-04-14 DIAGNOSIS — Z1231 Encounter for screening mammogram for malignant neoplasm of breast: Secondary | ICD-10-CM

## 2015-04-15 ENCOUNTER — Ambulatory Visit (INDEPENDENT_AMBULATORY_CARE_PROVIDER_SITE_OTHER): Payer: BLUE CROSS/BLUE SHIELD

## 2015-04-15 ENCOUNTER — Other Ambulatory Visit: Payer: Self-pay | Admitting: Gynecology

## 2015-04-15 DIAGNOSIS — M8580 Other specified disorders of bone density and structure, unspecified site: Secondary | ICD-10-CM

## 2015-04-15 DIAGNOSIS — Z13820 Encounter for screening for osteoporosis: Secondary | ICD-10-CM

## 2015-04-19 ENCOUNTER — Encounter: Payer: Self-pay | Admitting: Gynecology

## 2015-04-20 ENCOUNTER — Ambulatory Visit (HOSPITAL_COMMUNITY)
Admission: RE | Admit: 2015-04-20 | Discharge: 2015-04-20 | Disposition: A | Discharge status: Home / Self Care | Payer: BLUE CROSS/BLUE SHIELD | Source: Ambulatory Visit | Attending: Gynecology | Admitting: Gynecology

## 2015-04-20 DIAGNOSIS — Z1231 Encounter for screening mammogram for malignant neoplasm of breast: Secondary | ICD-10-CM | POA: Diagnosis present

## 2015-07-28 ENCOUNTER — Encounter: Payer: Self-pay | Admitting: Family Medicine

## 2015-07-28 ENCOUNTER — Telehealth: Payer: Self-pay | Admitting: Family Medicine

## 2015-07-28 ENCOUNTER — Ambulatory Visit (INDEPENDENT_AMBULATORY_CARE_PROVIDER_SITE_OTHER): Payer: BLUE CROSS/BLUE SHIELD | Admitting: Family Medicine

## 2015-07-28 VITALS — BP 112/70 | HR 66 | Temp 98.9°F | Wt 166.0 lb

## 2015-07-28 DIAGNOSIS — R05 Cough: Secondary | ICD-10-CM | POA: Diagnosis not present

## 2015-07-28 DIAGNOSIS — R059 Cough, unspecified: Secondary | ICD-10-CM

## 2015-07-28 DIAGNOSIS — H66001 Acute suppurative otitis media without spontaneous rupture of ear drum, right ear: Secondary | ICD-10-CM

## 2015-07-28 MED ORDER — AZITHROMYCIN 250 MG PO TABS
ORAL_TABLET | ORAL | Status: DC
Start: 2015-07-28 — End: 2016-06-13

## 2015-07-28 NOTE — Progress Notes (Signed)
Tana ConchStephen Addysen Louth, MD  Subjective:  Abigail Austin is a 64 y.o. year old very pleasant female patient who presents for/with See problem oriented charting ROS- see below  Past Medical History-  Patient Active Problem List   Diagnosis Date Noted  . Depression 01/12/2015    Priority: Medium  . Hyperlipidemia 01/12/2015    Priority: Medium  . Actinic keratosis 01/12/2015    Priority: Low  . Herpes simplex 01/12/2015    Priority: Low  . Hormone replacement therapy 01/12/2015    Priority: Low  . Osteopenia     Priority: Low  . History of colonic polyps 06/27/2007    Priority: Low    Medications- reviewed and updated Current Outpatient Prescriptions  Medication Sig Dispense Refill  . Cholecalciferol (VITAMIN D PO) Take 5,000 Units by mouth.      . estradiol (ESTRACE) 1 MG tablet Take 1 tablet (1 mg total) by mouth daily. 90 tablet 4  . FLUoxetine (PROZAC) 20 MG capsule Take 1 capsule (20 mg total) by mouth daily. 30 capsule 4  . glucosamine-chondroitin 500-400 MG tablet Take 1 tablet by mouth daily.      . Multiple Vitamin (MULTIVITAMIN) tablet Take 1 tablet by mouth daily.      . niacin (SLO-NIACIN) 500 MG tablet Take 500 mg by mouth at bedtime.    . progesterone (PROMETRIUM) 100 MG capsule Take 1 capsule (100 mg total) by mouth at bedtime. 90 capsule 4  . azithromycin (ZITHROMAX) 250 MG tablet Take 2 tabs on day 1, then 1 tab daily until finished 6 tablet 0  . valACYclovir (VALTREX) 500 MG tablet Take 1 tablet (500 mg total) by mouth 2 (two) times daily. As needed for outbreak (Patient not taking: Reported on 07/28/2015) 90 tablet 1   No current facility-administered medications for this visit.    Objective: BP 112/70 mmHg  Pulse 66  Temp(Src) 98.9 F (37.2 C)  Wt 166 lb (75.297 kg)  SpO2 97% Gen: NAD, resting comfortably Minimal sinus tenderness, some clear drainage in bilateral nares, L TM slightly cloudy but R TM erythematous and cloudy/grey membrane. Oropharynx largely  normal though some yellow drainage noted.  CV: RRR no murmurs rubs or gallops Lungs: CTAB no crackles, wheeze, rhonchi Abdomen: soft/nontender/nondistended/normal bowel sounds.  Ext: no edema Skin: warm, dry, no rash Neuro: grossly normal, moves all extremities  Assessment/Plan:  Cough x 5 weeks S:URI 5 weeks ago, better few days, then postnasal drip, productive cough. Tried rest and hydration. Some OTC therapies tried. Nasal congestion/fullness also persist. Not on ace inhibitor. No history of asthma though did have some wheeze with this earlier. No history of allergies. No history of reflux. Some mild pain right ear.  Ros- subjective fevers, some chills. No nausea, vomiting. Fatigue persists A/P: 5 weeks of cough with some prior wheeze- possible bronchitis. Patient also with R otitis media. With continued sinus fullness could also be sinusitis. Patient worries about mycoplasma pneumonia with duration. We opted to try azithromycin which will cover only clear diagnosis of R otitis media but should also help with sinusitis or would treat mycoplasma. In addition, bacterial component if present for bronchitis should be helped with this. We will plan on CXR if no resolution within a week.   Return precautions advised.   Meds ordered this encounter  Medications  . azithromycin (ZITHROMAX) 250 MG tablet    Sig: Take 2 tabs on day 1, then 1 tab daily until finished    Dispense:  6 tablet  Refill:  0

## 2015-07-28 NOTE — Patient Instructions (Signed)
So if symptoms do not resolve within 7 days, call me and we can schedule CXR for you

## 2015-07-28 NOTE — Telephone Encounter (Signed)
Pt was seen today and would like cough med sent to [facility]

## 2015-07-29 MED ORDER — GUAIFENESIN-CODEINE 100-10 MG/5ML PO SOLN: 5.0000 mL | Freq: Four times a day (QID) | ORAL | Status: DC | PRN

## 2015-07-29 NOTE — Telephone Encounter (Signed)
Pt is aware rx is waiting for her to pick up

## 2015-07-29 NOTE — Telephone Encounter (Signed)
Ardine BjorkKeba I had planned this and forgot to prescribe- can you see if you can phone or fax this in. Can you please confirm with pharmacy by phone that they have it? Then respond to patient. Thanks-

## 2015-07-29 NOTE — Telephone Encounter (Signed)
Rx upfront for pick up, unable to reach pt. Cant be sent in due to having codeine in it.

## 2016-05-23 DIAGNOSIS — D229 Melanocytic nevi, unspecified: Secondary | ICD-10-CM | POA: Diagnosis not present

## 2016-06-12 DIAGNOSIS — L908 Other atrophic disorders of skin: Secondary | ICD-10-CM | POA: Diagnosis not present

## 2016-06-12 DIAGNOSIS — H0230 Blepharochalasis unspecified eye, unspecified eyelid: Secondary | ICD-10-CM | POA: Diagnosis not present

## 2016-06-13 ENCOUNTER — Ambulatory Visit (INDEPENDENT_AMBULATORY_CARE_PROVIDER_SITE_OTHER): Payer: BLUE CROSS/BLUE SHIELD | Admitting: Gynecology

## 2016-06-13 ENCOUNTER — Encounter: Payer: Self-pay | Admitting: Gynecology

## 2016-06-13 ENCOUNTER — Telehealth: Payer: Self-pay | Admitting: *Deleted

## 2016-06-13 VITALS — BP 120/76 | Ht 62.5 in | Wt 167.0 lb

## 2016-06-13 DIAGNOSIS — N952 Postmenopausal atrophic vaginitis: Secondary | ICD-10-CM

## 2016-06-13 DIAGNOSIS — R2231 Localized swelling, mass and lump, right upper limb: Secondary | ICD-10-CM

## 2016-06-13 DIAGNOSIS — Z01419 Encounter for gynecological examination (general) (routine) without abnormal findings: Secondary | ICD-10-CM

## 2016-06-13 DIAGNOSIS — M8580 Other specified disorders of bone density and structure, unspecified site: Secondary | ICD-10-CM

## 2016-06-13 MED ORDER — VALACYCLOVIR HCL 500 MG PO TABS: 500.0000 mg | ORAL_TABLET | Freq: Two times a day (BID) | ORAL | 1 refills | Status: DC

## 2016-06-13 NOTE — Progress Notes (Signed)
    Abigail Austin 03/06/1951 [id number]        65 y.o.  [id number]  for annual exam.  Several issues noted below.  Past medical history,surgical history, problem list, medications, allergies, family history and social history were all reviewed and documented as reviewed in the EPIC chart.  ROS:  Performed with pertinent positives and negatives included in the history, assessment and plan.   Additional significant findings :  None   Exam: Abigail Austin assistant Vitals:   06/13/16 1358  BP: 120/76  Weight: 167 lb (75.8 kg)  Height: 5' 2.5" (1.588 m)   Body mass index is 30.06 kg/m.  General appearance:  Normal affect, orientation and appearance. Skin: Grossly normal HEENT: Without gross lesions.  No cervical or supraclavicular adenopathy. Thyroid normal.  Lungs:  Clear without wheezing, rales or rhonchi Cardiac: RR, without RMG Abdominal:  Soft, nontender, without masses, guarding, rebound, organomegaly or hernia Breasts:  Examined lying and sitting. Left without masses, retractions, discharge or axillary adenopathy. Right with fullness in the axillary region. No definitive masses. Breast without masses, retractions, discharge or adenopathy. Pelvic:  Ext/BUS/Vagina with atrophic changes  Cervix with atrophic changes. Pap smear done  Uterus anteverted, normal size, shape and contour, midline and mobile nontender   Adnexa without masses or tenderness    Anus and perineum normal   Rectovaginal normal sphincter tone without palpated masses or tenderness.    Assessment/Plan:  [age] y.o. [name] female for annual exam.   1. Postmenopausal/atrophic genital changes. Patient previously was on HRT but stopped it herself and has done well without significant hot flushes, night sweats, vaginal dryness or any vaginal bleeding. Reviewed HRT with her and the options to reinitiate versus staying off of it. Patient prefers to stay off at this point but will call if she desires to reinitiate this due  to symptoms. Patient knows report any bleeding. 2. Right axillary fullness. Not appreciated on prior exams. Patient relates has been present for years and in fact enlarged during her pregnancy. She was referred to a general surgeon who recommended just observing this feeling that it was accessory breast tissue. Patient notes that really hasn't changed over the past several years. Patient is due for mammogram now and will add axillary ultrasound just to look at this area for reassurance. Patient agrees with this. SBE monthly reviewed. 3. Pap smear 2014. Pap smear done today. No history of significant abnormal Pap smears previously. 4. HSV. Patient has occasional outbreaks and uses Valtrex as needed. Refill provided to her mail order pharmacy #[facility] of Valtrex 500 mg to be used twice daily for 3-5 days without break. 5. Osteopenia. DEXA 2016 T score -1.5 FRAX 8.5%/0.8%. Plan follow up DEXA next year to year interval. 6. Colonoscopy 2014. Repeat at their recommended interval. 7. Health maintenance. No routine lab work done as patient has this done elsewhere. Follow up for her mammogram and ultrasound otherwise follow up in one year, sooner if any issues.   Dara LordsFONTAINE,Malakhi Markwood P MD, 2:32 PM 06/13/2016

## 2016-06-13 NOTE — Telephone Encounter (Signed)
-----   Message from Dara Lordsimothy P Fontaine, MD sent at 06/13/2016  2:37 PM EDT ----- Schedule mammogram and ultrasound of right axilla. History of right axilla fullness consistent with accessory breast tissue.

## 2016-06-13 NOTE — Patient Instructions (Signed)
The breast center should call you to arrange for the mammogram and ultrasound. Call my office if you do not hear from them over the next week or 2.  You may obtain a copy of any labs that were done today by logging onto MyChart as outlined in the instructions provided with your AVS (after visit summary). The office will not call with normal lab results but certainly if there are any significant abnormalities then we will contact you.   Health Maintenance Adopting a healthy lifestyle and getting preventive care can go a long way to promote health and wellness. Talk with your health care provider about what schedule of regular examinations is right for you. This is a good chance for you to check in with your provider about disease prevention and staying healthy. In between checkups, there are plenty of things you can do on your own. Experts have done a lot of research about which lifestyle changes and preventive measures are most likely to keep you healthy. Ask your health care provider for more information. WEIGHT AND DIET  Eat a healthy diet  Be sure to include plenty of vegetables, fruits, low-fat dairy products, and lean protein.  Do not eat a lot of foods high in solid fats, added sugars, or salt.  Get regular exercise. This is one of the most important things you can do for your health.  Most adults should exercise for at least 150 minutes each week. The exercise should increase your heart rate and make you sweat (moderate-intensity exercise).  Most adults should also do strengthening exercises at least twice a week. This is in addition to the moderate-intensity exercise.  Maintain a healthy weight  Body mass index (BMI) is a measurement that can be used to identify possible weight problems. It estimates body fat based on height and weight. Your health care provider can help determine your BMI and help you achieve or maintain a healthy weight.  For females 28 years of age and older:    A BMI below 18.5 is considered underweight.  A BMI of 18.5 to 24.9 is normal.  A BMI of 25 to 29.9 is considered overweight.  A BMI of 30 and above is considered obese.  Watch levels of cholesterol and blood lipids  You should start having your blood tested for lipids and cholesterol at 64 years of age, then have this test every 5 years.  You may need to have your cholesterol levels checked more often if:  Your lipid or cholesterol levels are high.  You are older than 64 years of age.  You are at high risk for heart disease.  CANCER SCREENING   Lung Cancer  Lung cancer screening is recommended for adults 13-78 years old who are at high risk for lung cancer because of a history of smoking.  A yearly low-dose CT scan of the lungs is recommended for people who:  Currently smoke.  Have quit within the past 15 years.  Have at least a 30-pack-year history of smoking. A pack year is smoking an average of one pack of cigarettes a day for 1 year.  Yearly screening should continue until it has been 15 years since you quit.  Yearly screening should stop if you develop a health problem that would prevent you from having lung cancer treatment.  Breast Cancer  Practice breast self-awareness. This means understanding how your breasts normally appear and feel.  It also means doing regular breast self-exams. Let your health care provider know about  any changes, no matter how small.  If you are in your 20s or 30s, you should have a clinical breast exam (CBE) by a health care provider every 1-3 years as part of a regular health exam.  If you are 48 or older, have a CBE every year. Also consider having a breast X-ray (mammogram) every year.  If you have a family history of breast cancer, talk to your health care provider about genetic screening.  If you are at high risk for breast cancer, talk to your health care provider about having an MRI and a mammogram every year.  Breast  cancer gene (BRCA) assessment is recommended for women who have family members with BRCA-related cancers. BRCA-related cancers include:  Breast.  Ovarian.  Tubal.  Peritoneal cancers.  Results of the assessment will determine the need for genetic counseling and BRCA1 and BRCA2 testing. Cervical Cancer Routine pelvic examinations to screen for cervical cancer are no longer recommended for nonpregnant women who are considered low risk for cancer of the pelvic organs (ovaries, uterus, and vagina) and who do not have symptoms. A pelvic examination may be necessary if you have symptoms including those associated with pelvic infections. Ask your health care provider if a screening pelvic exam is right for you.   The Pap test is the screening test for cervical cancer for women who are considered at risk.  If you had a hysterectomy for a problem that was not cancer or a condition that could lead to cancer, then you no longer need Pap tests.  If you are older than 65 years, and you have had normal Pap tests for the past 10 years, you no longer need to have Pap tests.  If you have had past treatment for cervical cancer or a condition that could lead to cancer, you need Pap tests and screening for cancer for at least 20 years after your treatment.  If you no longer get a Pap test, assess your risk factors if they change (such as having a new sexual partner). This can affect whether you should start being screened again.  Some women have medical problems that increase their chance of getting cervical cancer. If this is the case for you, your health care provider may recommend more frequent screening and Pap tests.  The human papillomavirus (HPV) test is another test that may be used for cervical cancer screening. The HPV test looks for the virus that can cause cell changes in the cervix. The cells collected during the Pap test can be tested for HPV.  The HPV test can be used to screen women 34 years  of age and older. Getting tested for HPV can extend the interval between normal Pap tests from three to five years.  An HPV test also should be used to screen women of any age who have unclear Pap test results.  After 64 years of age, women should have HPV testing as often as Pap tests.  Colorectal Cancer  This type of cancer can be detected and often prevented.  Routine colorectal cancer screening usually begins at 64 years of age and continues through 64 years of age.  Your health care provider may recommend screening at an earlier age if you have risk factors for colon cancer.  Your health care provider may also recommend using home test kits to check for hidden blood in the stool.  A small camera at the end of a tube can be used to examine your colon directly (sigmoidoscopy or  colonoscopy). This is done to check for the earliest forms of colorectal cancer.  Routine screening usually begins at age 94.  Direct examination of the colon should be repeated every 5-10 years through 64 years of age. However, you may need to be screened more often if early forms of precancerous polyps or small growths are found. Skin Cancer  Check your skin from head to toe regularly.  Tell your health care provider about any new moles or changes in moles, especially if there is a change in a mole's shape or color.  Also tell your health care provider if you have a mole that is larger than the size of a pencil eraser.  Always use sunscreen. Apply sunscreen liberally and repeatedly throughout the day.  Protect yourself by wearing long sleeves, pants, a wide-brimmed hat, and sunglasses whenever you are outside. HEART DISEASE, DIABETES, AND HIGH BLOOD PRESSURE   Have your blood pressure checked at least every 1-2 years. High blood pressure causes heart disease and increases the risk of stroke.  If you are between 86 years and 35 years old, ask your health care provider if you should take aspirin to  prevent strokes.  Have regular diabetes screenings. This involves taking a blood sample to check your fasting blood sugar level.  If you are at a normal weight and have a low risk for diabetes, have this test once every three years after 64 years of age.  If you are overweight and have a high risk for diabetes, consider being tested at a younger age or more often. PREVENTING INFECTION  Hepatitis B  If you have a higher risk for hepatitis B, you should be screened for this virus. You are considered at high risk for hepatitis B if:  You were born in a country where hepatitis B is common. Ask your health care provider which countries are considered high risk.  Your parents were born in a high-risk country, and you have not been immunized against hepatitis B (hepatitis B vaccine).  You have HIV or AIDS.  You use needles to inject street drugs.  You live with someone who has hepatitis B.  You have had sex with someone who has hepatitis B.  You get hemodialysis treatment.  You take certain medicines for conditions, including cancer, organ transplantation, and autoimmune conditions. Hepatitis C  Blood testing is recommended for:  Everyone born from [birth year] through 1964.  Anyone with known risk factors for hepatitis C. Sexually transmitted infections (STIs)  You should be screened for sexually transmitted infections (STIs) including gonorrhea and chlamydia if:  You are sexually active and are younger than 64 years of age.  You are older than 64 years of age and your health care provider tells you that you are at risk for this type of infection.  Your sexual activity has changed since you were last screened and you are at an increased risk for chlamydia or gonorrhea. Ask your health care provider if you are at risk.  If you do not have HIV, but are at risk, it may be recommended that you take a prescription medicine daily to prevent HIV infection. This is called pre-exposure  prophylaxis (PrEP). You are considered at risk if:  You are sexually active and do not regularly use condoms or know the HIV status of your partner(s).  You take drugs by injection.  You are sexually active with a partner who has HIV. Talk with your health care provider about whether you are at high risk  of being infected with HIV. If you choose to begin PrEP, you should first be tested for HIV. You should then be tested every 3 months for as long as you are taking PrEP.  PREGNANCY   If you are premenopausal and you may become pregnant, ask your health care provider about preconception counseling.  If you may become pregnant, take 400 to 800 micrograms (mcg) of folic acid every day.  If you want to prevent pregnancy, talk to your health care provider about birth control (contraception). OSTEOPOROSIS AND MENOPAUSE   Osteoporosis is a disease in which the bones lose minerals and strength with aging. This can result in serious bone fractures. Your risk for osteoporosis can be identified using a bone density scan.  If you are 21 years of age or older, or if you are at risk for osteoporosis and fractures, ask your health care provider if you should be screened.  Ask your health care provider whether you should take a calcium or vitamin D supplement to lower your risk for osteoporosis.  Menopause may have certain physical symptoms and risks.  Hormone replacement therapy may reduce some of these symptoms and risks. Talk to your health care provider about whether hormone replacement therapy is right for you.  HOME CARE INSTRUCTIONS   Schedule regular health, dental, and eye exams.  Stay current with your immunizations.   Do not use any tobacco products including cigarettes, chewing tobacco, or electronic cigarettes.  If you are pregnant, do not drink alcohol.  If you are breastfeeding, limit how much and how often you drink alcohol.  Limit alcohol intake to no more than 1 drink per  day for nonpregnant women. One drink equals 12 ounces of beer, 5 ounces of wine, or 1 ounces of hard liquor.  Do not use street drugs.  Do not share needles.  Ask your health care provider for help if you need support or information about quitting drugs.  Tell your health care provider if you often feel depressed.  Tell your health care provider if you have ever been abused or do not feel safe at home. Document Released: 03/27/2011 Document Revised: 01/26/2014 Document Reviewed: 08/13/2013 [facility] Patient Information 2015 [place], [place]. This information is not intended to replace advice given to you by your health care provider. Make sure you discuss any questions you have with your health care provider.

## 2016-06-13 NOTE — Addendum Note (Signed)
Addended by: Dayna BarkerGARDNER, KIMBERLY K on: 06/13/2016 03:25 PM   Modules accepted: Orders

## 2016-06-13 NOTE — Telephone Encounter (Signed)
Order placed at [facility] center they will call to schedule

## 2016-06-14 LAB — PAP IG W/ RFLX HPV ASCU

## 2016-06-14 NOTE — Telephone Encounter (Signed)
Appointment on 06/16/16 @ 8:30am at breast center pt informed.

## 2016-06-16 ENCOUNTER — Ambulatory Visit
Admission: RE | Admit: 2016-06-16 | Discharge: 2016-06-16 | Disposition: A | Discharge status: Home / Self Care | Payer: BLUE CROSS/BLUE SHIELD | Source: Ambulatory Visit | Attending: Gynecology | Admitting: Gynecology

## 2016-06-16 ENCOUNTER — Other Ambulatory Visit: Payer: Self-pay | Admitting: Gynecology

## 2016-06-16 DIAGNOSIS — R922 Inconclusive mammogram: Secondary | ICD-10-CM | POA: Diagnosis not present

## 2016-06-16 DIAGNOSIS — R2231 Localized swelling, mass and lump, right upper limb: Secondary | ICD-10-CM

## 2016-06-16 DIAGNOSIS — R222 Localized swelling, mass and lump, trunk: Secondary | ICD-10-CM | POA: Diagnosis not present

## 2016-06-20 DIAGNOSIS — E7800 Pure hypercholesterolemia, unspecified: Secondary | ICD-10-CM | POA: Diagnosis not present

## 2016-06-22 DIAGNOSIS — H0236 Blepharochalasis left eye, unspecified eyelid: Secondary | ICD-10-CM | POA: Diagnosis not present

## 2016-06-22 DIAGNOSIS — H2513 Age-related nuclear cataract, bilateral: Secondary | ICD-10-CM | POA: Diagnosis not present

## 2016-06-22 DIAGNOSIS — H0231 Blepharochalasis right upper eyelid: Secondary | ICD-10-CM | POA: Diagnosis not present

## 2016-06-22 DIAGNOSIS — H02423 Myogenic ptosis of bilateral eyelids: Secondary | ICD-10-CM | POA: Diagnosis not present

## 2016-06-22 DIAGNOSIS — H0233 Blepharochalasis right eye, unspecified eyelid: Secondary | ICD-10-CM | POA: Diagnosis not present

## 2016-06-22 DIAGNOSIS — H0234 Blepharochalasis left upper eyelid: Secondary | ICD-10-CM | POA: Diagnosis not present

## 2017-10-01 DIAGNOSIS — Z79899 Other long term (current) drug therapy: Secondary | ICD-10-CM | POA: Diagnosis not present

## 2017-10-01 DIAGNOSIS — E7800 Pure hypercholesterolemia, unspecified: Secondary | ICD-10-CM | POA: Diagnosis not present

## 2017-10-01 DIAGNOSIS — Z23 Encounter for immunization: Secondary | ICD-10-CM | POA: Diagnosis not present

## 2017-10-01 DIAGNOSIS — Z0000 Encounter for general adult medical examination without abnormal findings: Secondary | ICD-10-CM | POA: Diagnosis not present

## 2018-09-03 ENCOUNTER — Other Ambulatory Visit: Payer: Self-pay | Admitting: Gynecology

## 2018-09-03 ENCOUNTER — Telehealth: Payer: Self-pay

## 2018-09-03 MED ORDER — VALACYCLOVIR HCL 500 MG PO TABS: 500.0000 mg | ORAL_TABLET | Freq: Two times a day (BID) | ORAL | 2 refills | Status: DC

## 2018-09-03 NOTE — Telephone Encounter (Signed)
Needs refill on Valtrex.  Last seen in office 06/11/2016 for a CE.  I scheduled 10/17/18.

## 2018-09-11 ENCOUNTER — Other Ambulatory Visit: Payer: Self-pay | Admitting: Gynecology

## 2018-09-11 DIAGNOSIS — Z1231 Encounter for screening mammogram for malignant neoplasm of breast: Secondary | ICD-10-CM

## 2018-10-16 ENCOUNTER — Ambulatory Visit
Admission: RE | Admit: 2018-10-16 | Discharge: 2018-10-16 | Disposition: A | Discharge status: Home / Self Care | Payer: BLUE CROSS/BLUE SHIELD | Source: Ambulatory Visit | Attending: Gynecology | Admitting: Gynecology

## 2018-10-16 DIAGNOSIS — Z1231 Encounter for screening mammogram for malignant neoplasm of breast: Secondary | ICD-10-CM | POA: Diagnosis not present

## 2018-10-17 ENCOUNTER — Ambulatory Visit (INDEPENDENT_AMBULATORY_CARE_PROVIDER_SITE_OTHER): Payer: BLUE CROSS/BLUE SHIELD | Admitting: Gynecology

## 2018-10-17 ENCOUNTER — Encounter: Payer: Self-pay | Admitting: Gynecology

## 2018-10-17 VITALS — BP 116/64 | Ht 63.0 in | Wt 160.0 lb

## 2018-10-17 DIAGNOSIS — M8580 Other specified disorders of bone density and structure, unspecified site: Secondary | ICD-10-CM | POA: Diagnosis not present

## 2018-10-17 DIAGNOSIS — Z01419 Encounter for gynecological examination (general) (routine) without abnormal findings: Secondary | ICD-10-CM | POA: Diagnosis not present

## 2018-10-17 DIAGNOSIS — N952 Postmenopausal atrophic vaginitis: Secondary | ICD-10-CM | POA: Diagnosis not present

## 2018-10-17 MED ORDER — VALACYCLOVIR HCL 500 MG PO TABS: 500.0000 mg | ORAL_TABLET | Freq: Two times a day (BID) | ORAL | 2 refills | Status: DC

## 2018-10-17 NOTE — Addendum Note (Signed)
Addended by: Dayna BarkerGARDNER, Jyaire Koudelka K on: 10/17/2018 09:49 AM   Modules accepted: Orders

## 2018-10-17 NOTE — Patient Instructions (Signed)
Followup for bone density as scheduled. 

## 2018-10-17 NOTE — Progress Notes (Signed)
    Gabriel Earingatricia Gerr Ent 03/13/1951 [id number]        68 y.o.  [id number] for annual gynecologic exam.  Without gynecologic complaints  Past medical history,surgical history, problem list, medications, allergies, family history and social history were all reviewed and documented as reviewed in the EPIC chart.  ROS:  Performed with pertinent positives and negatives included in the history, assessment and plan.   Additional significant findings : None   Exam: Kennon PortelaKim Gardner assistant Vitals:   10/17/18 0840  BP: 116/64  Weight: 160 lb (72.6 kg)  Height: 5\' 3"  (1.6 m)   Body mass index is 28.34 kg/m.  General appearance:  Normal affect, orientation and appearance. Skin: Grossly normal HEENT: Without gross lesions.  No cervical or supraclavicular adenopathy. Thyroid normal.  Lungs:  Clear without wheezing, rales or rhonchi Cardiac: RR, without RMG Abdominal:  Soft, nontender, without masses, guarding, rebound, organomegaly or hernia Breasts:  Examined lying and sitting without masses, retractions, discharge or axillary adenopathy. Pelvic:  Ext, BUS, Vagina: Normal with atrophic changes  Cervix: Normal with atrophic changes.  Pap smear done  Uterus: Anteverted, normal size, shape and contour, midline and mobile nontender   Adnexa: Without masses or tenderness    Anus and perineum: Normal   Rectovaginal: Normal sphincter tone without palpated masses or tenderness.    Assessment/Plan:  68 y.o. [id number] female for annual gynecologic exam.  1. Postmenopausal.  No significant menopausal symptoms or vaginal bleeding. 2. Osteopenia.  DEXA 2016 T score -1.6 FRAX 8.5% / 0.8%.  Schedule DEXA now and patient will follow-up for this. 3. History of right axillary fullness with work-up previously negative.  Exam today unremarkable without significant abnormalities palpated.  Mammography yesterday.  We will continue with annual mammography next year.  We will continue with self breast exams and report  any issues. 4. Colonoscopy 2014.  Repeat at their recommended interval. 5. Pap smear 2017.  Pap smear done today.  No history of significant abnormal Pap smears. 6. HSV.  Occasional outbreaks.  Valtrex 500 mg #60 with 2 refills provided to use as needed. 7. Health maintenance.  No routine lab work done as patient does this elsewhere.  Follow-up 1 year, sooner as needed.   Dara Lordsimothy P Joeanne Robicheaux MD, 9:34 AM 10/17/2018

## 2018-10-18 LAB — PAP IG W/ RFLX HPV ASCU

## 2018-10-30 ENCOUNTER — Ambulatory Visit (INDEPENDENT_AMBULATORY_CARE_PROVIDER_SITE_OTHER): Payer: BLUE CROSS/BLUE SHIELD

## 2018-10-30 DIAGNOSIS — M8589 Other specified disorders of bone density and structure, multiple sites: Secondary | ICD-10-CM

## 2018-10-30 DIAGNOSIS — Z780 Asymptomatic menopausal state: Secondary | ICD-10-CM

## 2018-10-30 DIAGNOSIS — M8580 Other specified disorders of bone density and structure, unspecified site: Secondary | ICD-10-CM

## 2018-10-31 ENCOUNTER — Other Ambulatory Visit: Payer: Self-pay | Admitting: Gynecology

## 2018-10-31 ENCOUNTER — Encounter: Payer: Self-pay | Admitting: Gynecology

## 2018-10-31 DIAGNOSIS — M8589 Other specified disorders of bone density and structure, multiple sites: Secondary | ICD-10-CM

## 2018-10-31 DIAGNOSIS — Z780 Asymptomatic menopausal state: Secondary | ICD-10-CM

## 2018-11-11 DIAGNOSIS — Z23 Encounter for immunization: Secondary | ICD-10-CM | POA: Diagnosis not present

## 2018-11-11 DIAGNOSIS — Z8249 Family history of ischemic heart disease and other diseases of the circulatory system: Secondary | ICD-10-CM | POA: Diagnosis not present

## 2018-11-11 DIAGNOSIS — E7800 Pure hypercholesterolemia, unspecified: Secondary | ICD-10-CM | POA: Diagnosis not present

## 2018-11-11 DIAGNOSIS — Z0000 Encounter for general adult medical examination without abnormal findings: Secondary | ICD-10-CM | POA: Diagnosis not present

## 2018-11-11 DIAGNOSIS — Z79899 Other long term (current) drug therapy: Secondary | ICD-10-CM | POA: Diagnosis not present

## 2018-11-20 DIAGNOSIS — M25561 Pain in right knee: Secondary | ICD-10-CM | POA: Diagnosis not present

## 2019-01-10 DIAGNOSIS — Z23 Encounter for immunization: Secondary | ICD-10-CM | POA: Diagnosis not present

## 2019-03-31 DIAGNOSIS — Z03818 Encounter for observation for suspected exposure to other biological agents ruled out: Secondary | ICD-10-CM | POA: Diagnosis not present

## 2019-07-02 ENCOUNTER — Encounter: Payer: Self-pay | Admitting: Gynecology

## 2019-10-21 ENCOUNTER — Other Ambulatory Visit: Payer: Self-pay

## 2019-10-21 ENCOUNTER — Ambulatory Visit: Payer: Self-pay

## 2019-10-22 ENCOUNTER — Ambulatory Visit (INDEPENDENT_AMBULATORY_CARE_PROVIDER_SITE_OTHER): Payer: BC Managed Care – PPO | Admitting: Obstetrics and Gynecology

## 2019-10-22 ENCOUNTER — Encounter: Payer: Self-pay | Admitting: Obstetrics and Gynecology

## 2019-10-22 VITALS — BP 118/76 | Ht 62.0 in | Wt 168.0 lb

## 2019-10-22 DIAGNOSIS — M8580 Other specified disorders of bone density and structure, unspecified site: Secondary | ICD-10-CM

## 2019-10-22 DIAGNOSIS — Z01419 Encounter for gynecological examination (general) (routine) without abnormal findings: Secondary | ICD-10-CM | POA: Diagnosis not present

## 2019-10-22 MED ORDER — VALACYCLOVIR HCL 500 MG PO TABS: 500.0000 mg | ORAL_TABLET | Freq: Two times a day (BID) | ORAL | 2 refills | Status: DC

## 2019-10-22 NOTE — Patient Instructions (Addendum)
Thank you for coming in today. We will plan to repeat the DEXA scan next year 2022. Your next Pap smear will be planned for 2023. I did send in your requested prescription to your pharmacy.

## 2019-10-22 NOTE — Progress Notes (Signed)
   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 [phone] female here for an annual routine gynecologic exam. She has no gynecologic concerns.  Current Outpatient Medications  Medication Sig Dispense Refill  . Cholecalciferol (VITAMIN D PO) Take 5,000 Units by mouth.      Marland Kitchen glucosamine-chondroitin 500-400 MG tablet Take 1 tablet by mouth daily.      . simvastatin (ZOCOR) 10 MG tablet Take 10 mg by mouth daily.    . valACYclovir (VALTREX) 500 MG tablet Take 1 tablet (500 mg total) by mouth 2 (two) times daily. As needed for outbreak 60 tablet 2   No current facility-administered medications for this visit.   Allergies: Patient has no known allergies.  No LMP recorded. Patient is postmenopausal.  Past medical history,surgical history, problem list, medications, allergies, family history and social history were all reviewed and documented as reviewed in the EPIC chart.  ROS:  Feeling well. GYN ROS: no abnormal bleeding, pelvic pain or discharge, no breast pain or new or enlarging lumps on self exam. No neurological complaints.   OBJECTIVE:  BP 118/76   Ht 5\' 2"  (1.575 m)   Wt 168 lb (76.2 kg)   BMI 30.73 kg/m  The patient appears well, alert, normal conversation Neck supple. No cervical or supraclavicular adenopathy or thyromegaly.  Lungs are clear, good air entry, no wheezes, rhonchi or rales. S1 and S2 normal, no murmurs, regular rate and rhythm.  Abdomen soft without tenderness, guarding, mass or organomegaly.  Neurological is normal, no focal findings.  BREAST EXAM: breasts appear normal, no suspicious masses, no skin or nipple changes or axillary nodes  PELVIC EXAM: VULVA: normal appearing vulva with no masses, tenderness or lesions, mild atrophic changes noted.  VAGINA: normal appearing vagina with normal color and discharge, no lesions, CERVIX: normal appearing cervix without discharge or lesions, UTERUS: uterus is normal size, shape,  consistency and nontender, ADNEXA: normal adnexa in size, nontender and no masses, RECTAL: normal rectal, no masses  Chaperone: present during the examination  ASSESSMENT:  69 y.o. 80 here for annual gynecologic exam  PLAN:   1. Postmenopausal. No significant gynecologic concerns at this time. 2. Pap smear 09/2018 normal. Not repeated today. No prior history of abnormal Pap smears. Next Pap smear due 2023 following the current screening guidelines calling for the 3-year interval. 3. Mammogram 09/2018. Will continue with annual mammography. Breast exam normal today. 4. Colonoscopy 2014. Recommended that she continue per the prescribed interval.  5. Osteopenia.  DEXA 10/2018 indicated stable findings.  Recommend exercise and vitamin D/calcium intake. Recommend to repeat in 2022.  6. History HSV. Occasional outbreaks. No lesions noted on exam today. Valtrex 500 mg #60 x 2 refills refilled per patient request. 7. Health maintenance.  No lab work as she has this completed with her primary care provider.  Cholesterol managed by her primary care provider.   Return annually or sooner, prn.  2023 MD  10/22/19

## 2019-10-30 ENCOUNTER — Ambulatory Visit: Payer: BC Managed Care – PPO | Attending: Internal Medicine

## 2019-10-30 DIAGNOSIS — Z23 Encounter for immunization: Secondary | ICD-10-CM | POA: Insufficient documentation

## 2019-10-30 NOTE — Progress Notes (Signed)
   Covid-19 Vaccination Clinic  Name:  Abigail Austin    MRN: [id number] DOB: Mar 15, 1951  10/30/2019  Abigail Austin was observed post Covid-19 immunization for 15 minutes without incidence. She was provided with Vaccine Information Sheet and instruction to access the V-Safe system.   Abigail Austin was instructed to call 911 with any severe reactions post vaccine: Marland Kitchen Difficulty breathing  . Swelling of your face and throat  . A fast heartbeat  . A bad rash all over your body  . Dizziness and weakness    Immunizations Administered    Name Date Dose VIS Date Route   Pfizer COVID-19 Vaccine 10/30/2019  4:17 PM 0.3 mL 09/05/2019 Intramuscular   Manufacturer: [employer], [employer]   Lot: [id number]   NDC: [address]-5

## 2019-11-17 DIAGNOSIS — Z79899 Other long term (current) drug therapy: Secondary | ICD-10-CM | POA: Diagnosis not present

## 2019-11-17 DIAGNOSIS — Z0000 Encounter for general adult medical examination without abnormal findings: Secondary | ICD-10-CM | POA: Diagnosis not present

## 2019-11-17 DIAGNOSIS — E7800 Pure hypercholesterolemia, unspecified: Secondary | ICD-10-CM | POA: Diagnosis not present

## 2019-11-18 ENCOUNTER — Encounter: Payer: Self-pay | Admitting: Neurology

## 2019-11-24 ENCOUNTER — Ambulatory Visit: Payer: BC Managed Care – PPO | Attending: Internal Medicine

## 2019-11-24 DIAGNOSIS — Z23 Encounter for immunization: Secondary | ICD-10-CM | POA: Insufficient documentation

## 2019-11-24 NOTE — Progress Notes (Signed)
   Covid-19 Vaccination Clinic  Name:  Abigail Austin    MRN: [id number] DOB: 08/16/1951  11/24/2019  Abigail Austin was observed post Covid-19 immunization for 15 minutes without incidence. She was provided with Vaccine Information Sheet and instruction to access the V-Safe system.   Abigail Austin was instructed to call 911 with any severe reactions post vaccine: Marland Kitchen Difficulty breathing  . Swelling of your face and throat  . A fast heartbeat  . A bad rash all over your body  . Dizziness and weakness    Immunizations Administered    Name Date Dose VIS Date Route   Pfizer COVID-19 Vaccine 11/24/2019  3:52 PM 0.3 mL 09/05/2019 Intramuscular   Manufacturer: [employer], [employer]   Lot: [id number]   NDC: [address]-1

## 2020-01-08 ENCOUNTER — Ambulatory Visit: Payer: BC Managed Care – PPO | Admitting: Neurology

## 2020-06-08 ENCOUNTER — Other Ambulatory Visit: Payer: Self-pay | Admitting: Gastroenterology

## 2020-06-16 IMAGING — MG DIGITAL SCREENING BILATERAL MAMMOGRAM WITH CAD
4 series · 4 of 4 positions shown · non-contrast
Comparison: Previous exam(s).

CLINICAL DATA: Screening.

EXAM:
DIGITAL SCREENING BILATERAL MAMMOGRAM WITH CAD

[L MLO]
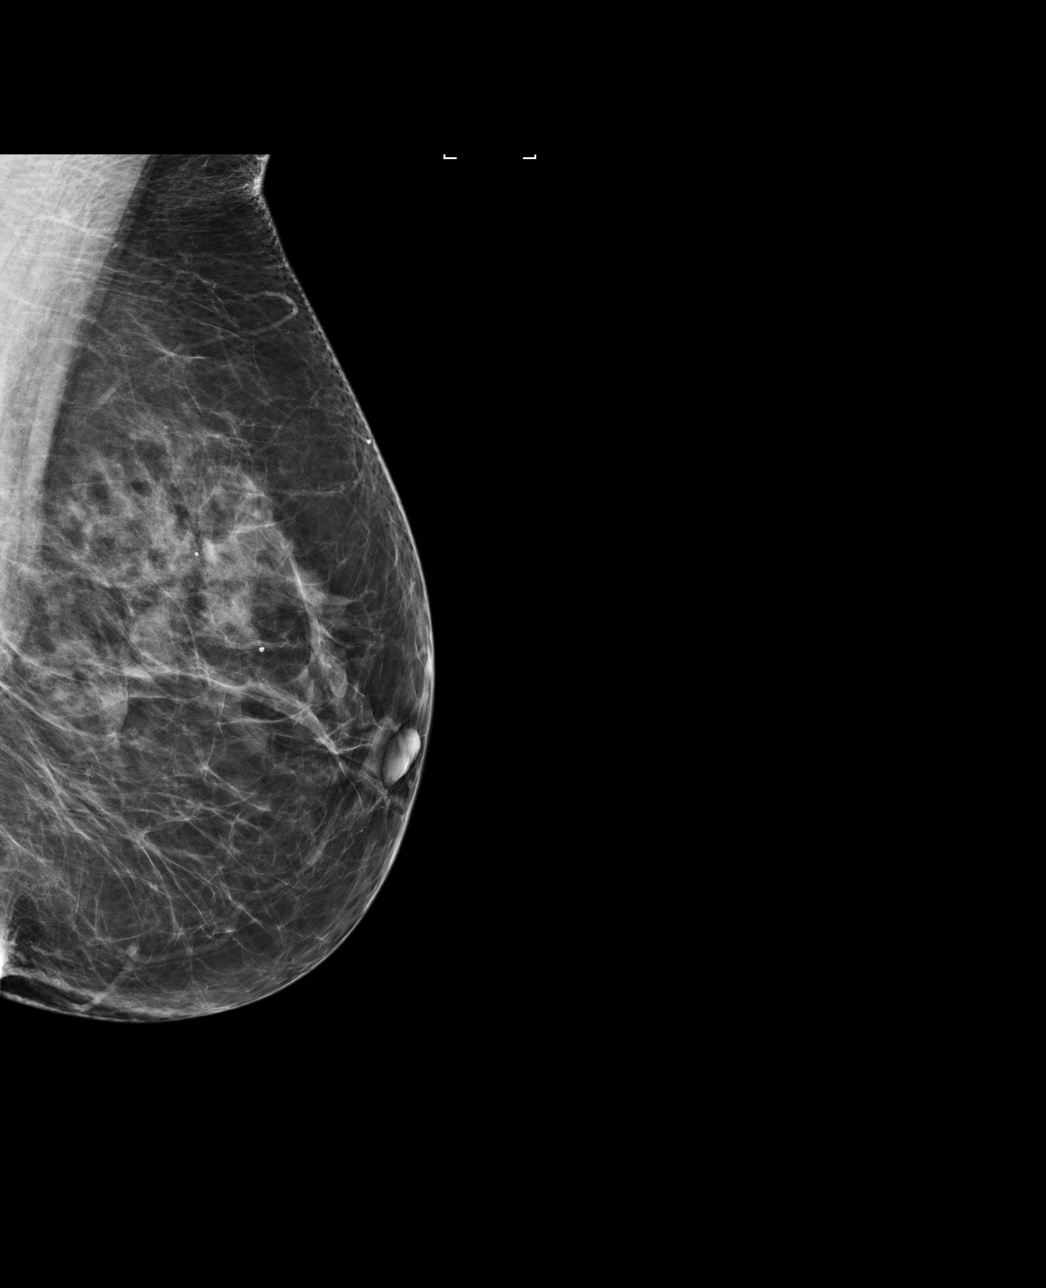

[L CC]
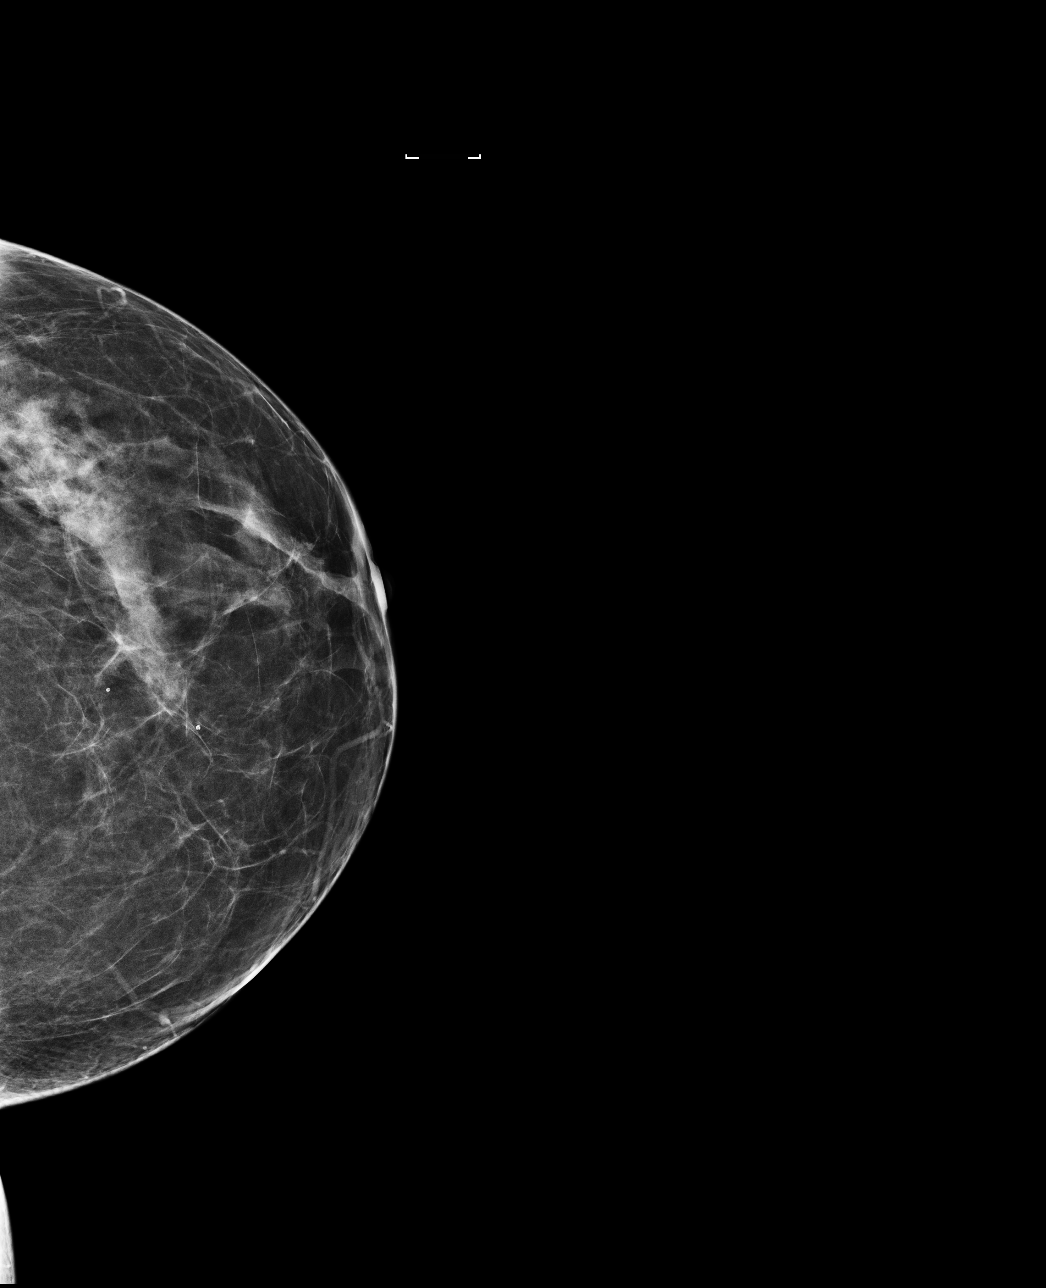

[R MLO]
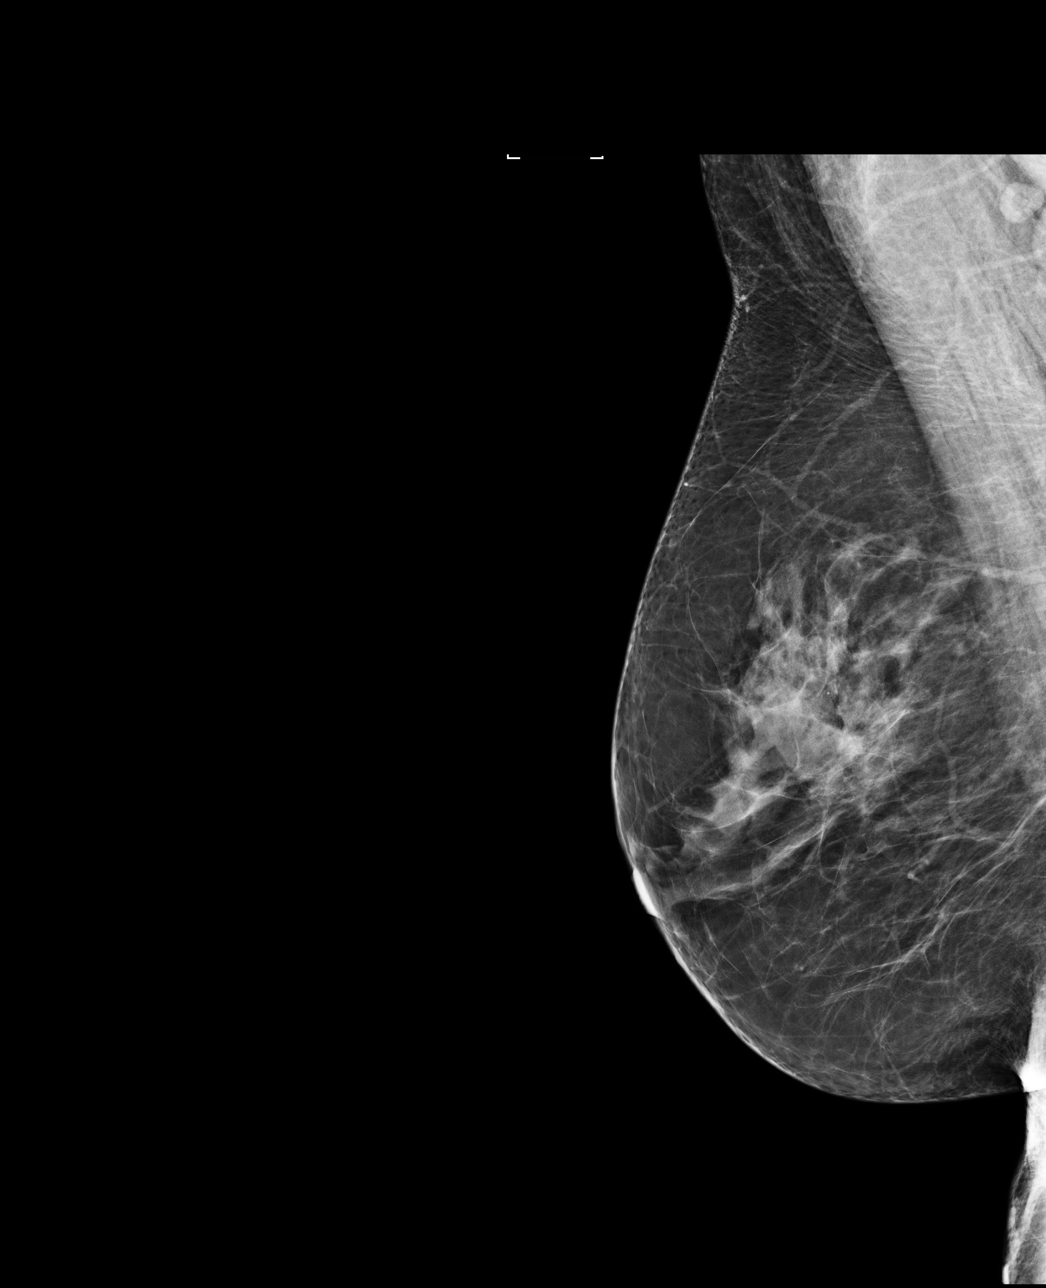

[R CC]
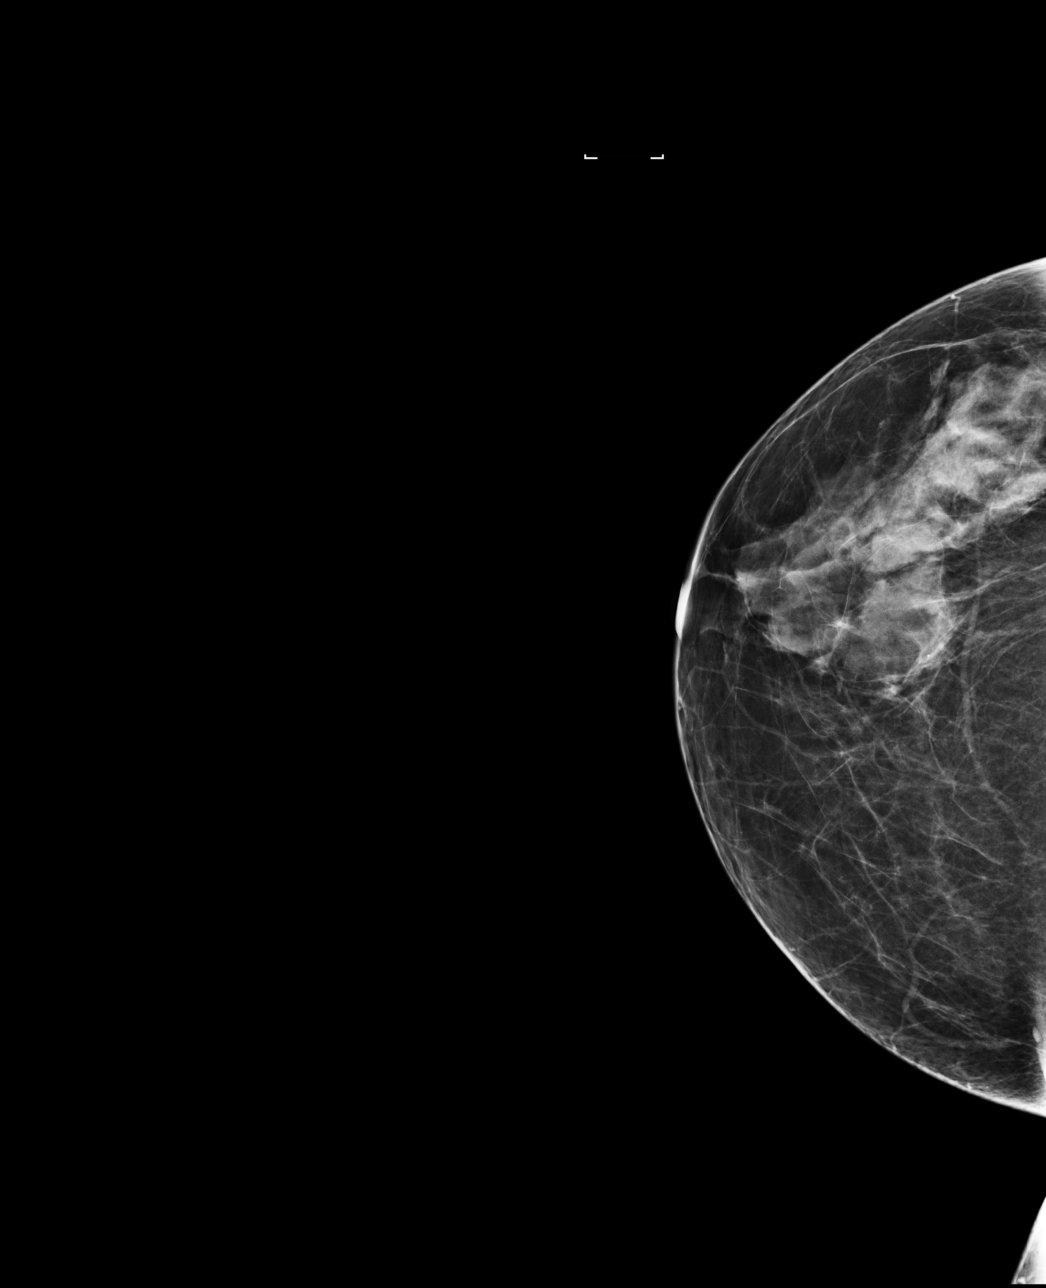

[4 of 4 positions shown; findings below may reference images not displayed]

ACR Breast Density Category c: The breast tissue is heterogeneously
dense, which may obscure small masses.
FINDINGS: There are no findings suspicious for malignancy. Images were
processed with CAD.
IMPRESSION: No mammographic evidence of malignancy. A result letter of this
screening mammogram will be mailed directly to the patient.

RECOMMENDATION:
Screening mammogram in one year. (Code:[id number])

BI-RADS CATEGORY  1: Negative.

## 2020-07-24 ENCOUNTER — Other Ambulatory Visit (HOSPITAL_COMMUNITY)
Admission: RE | Admit: 2020-07-24 | Discharge: 2020-07-24 | Disposition: A | Discharge status: Home / Self Care | Payer: BC Managed Care – PPO | Source: Ambulatory Visit | Attending: Gastroenterology | Admitting: Gastroenterology

## 2020-07-24 DIAGNOSIS — Z20822 Contact with and (suspected) exposure to covid-19: Secondary | ICD-10-CM | POA: Diagnosis not present

## 2020-07-24 DIAGNOSIS — Z01812 Encounter for preprocedural laboratory examination: Secondary | ICD-10-CM | POA: Insufficient documentation

## 2020-07-25 LAB — SARS CORONAVIRUS 2 (TAT 6-24 HRS): SARS Coronavirus 2: NEGATIVE

## 2020-07-28 ENCOUNTER — Encounter (HOSPITAL_COMMUNITY)
Admission: RE | Disposition: A | Discharge status: Home / Self Care | Payer: Self-pay | Source: Home / Self Care | Attending: Gastroenterology

## 2020-07-28 ENCOUNTER — Ambulatory Visit (HOSPITAL_COMMUNITY)
Admission: RE | Admit: 2020-07-28 | Discharge: 2020-07-28 | Disposition: A | Discharge status: Home / Self Care | Payer: BC Managed Care – PPO | Attending: Gastroenterology | Admitting: Gastroenterology

## 2020-07-28 ENCOUNTER — Encounter (HOSPITAL_COMMUNITY): Payer: Self-pay | Admitting: Gastroenterology

## 2020-07-28 ENCOUNTER — Other Ambulatory Visit: Payer: Self-pay

## 2020-07-28 ENCOUNTER — Ambulatory Visit (HOSPITAL_COMMUNITY): Payer: BC Managed Care – PPO | Admitting: Anesthesiology

## 2020-07-28 DIAGNOSIS — Z888 Allergy status to other drugs, medicaments and biological substances status: Secondary | ICD-10-CM | POA: Insufficient documentation

## 2020-07-28 DIAGNOSIS — Z1211 Encounter for screening for malignant neoplasm of colon: Secondary | ICD-10-CM | POA: Diagnosis not present

## 2020-07-28 DIAGNOSIS — K648 Other hemorrhoids: Secondary | ICD-10-CM | POA: Diagnosis not present

## 2020-07-28 DIAGNOSIS — K5730 Diverticulosis of large intestine without perforation or abscess without bleeding: Secondary | ICD-10-CM | POA: Insufficient documentation

## 2020-07-28 DIAGNOSIS — K644 Residual hemorrhoidal skin tags: Secondary | ICD-10-CM | POA: Insufficient documentation

## 2020-07-28 DIAGNOSIS — E785 Hyperlipidemia, unspecified: Secondary | ICD-10-CM | POA: Diagnosis not present

## 2020-07-28 DIAGNOSIS — Z86010 Personal history of colonic polyps: Secondary | ICD-10-CM | POA: Diagnosis not present

## 2020-07-28 HISTORY — PX: COLONOSCOPY WITH PROPOFOL: SHX5780

## 2020-07-28 SURGERY — COLONOSCOPY WITH PROPOFOL
Anesthesia: Monitor Anesthesia Care

## 2020-07-28 MED ORDER — SODIUM CHLORIDE 0.9 % IV SOLN: INTRAVENOUS | Status: DC

## 2020-07-28 MED ORDER — LACTATED RINGERS IV SOLN
INTRAVENOUS | Status: AC | PRN
  Administered 2020-07-28: 20 mL/h via INTRAVENOUS

## 2020-07-28 MED ORDER — PROPOFOL 500 MG/50ML IV EMUL
INTRAVENOUS | Status: DC | PRN
  Administered 2020-07-28: 300 ug/kg/min via INTRAVENOUS

## 2020-07-28 SURGICAL SUPPLY — 21 items

## 2020-07-28 NOTE — Transfer of Care (Signed)
Immediate Anesthesia Transfer of Care Note  Patient: Abigail Austin  Procedure(s) Performed: COLONOSCOPY WITH PROPOFOL (N/A )  Patient Location: PACU  Anesthesia Type:MAC  Level of Consciousness: awake, alert , oriented and patient cooperative  Airway & Oxygen Therapy: Patient Spontanous Breathing and Patient connected to face mask oxygen  Post-op Assessment: Report given to RN, Post -op Vital signs reviewed and stable and Patient moving all extremities  Post vital signs: Reviewed and stable  Last Vitals:  Vitals Value Taken Time  BP 116/43 07/28/20 1320  Temp 37.1 C 07/28/20 1310  Pulse 53 07/28/20 1321  Resp 12 07/28/20 1321  SpO2 98 % 07/28/20 1321  Vitals shown include unvalidated device data.  Last Pain:  Vitals:   07/28/20 1320  TempSrc:   PainSc: 0-No pain         Complications: No complications documented.

## 2020-07-28 NOTE — Anesthesia Preprocedure Evaluation (Signed)
Anesthesia Evaluation  Patient identified by MRN, date of birth, ID band Patient awake    Reviewed: Allergy & Precautions, NPO status , Patient's Chart, lab work & pertinent test results  Airway Mallampati: II  TM Distance: >3 FB Neck ROM: Full    Dental no notable dental hx.    Pulmonary neg pulmonary ROS,    Pulmonary exam normal breath sounds clear to auscultation       Cardiovascular negative cardio ROS Normal cardiovascular exam Rhythm:Regular Rate:Normal     Neuro/Psych negative neurological ROS  negative psych ROS   GI/Hepatic negative GI ROS, Neg liver ROS,   Endo/Other  negative endocrine ROS  Renal/GU negative Renal ROS  negative genitourinary   Musculoskeletal negative musculoskeletal ROS (+)   Abdominal   Peds negative pediatric ROS (+)  Hematology negative hematology ROS (+)   Anesthesia Other Findings   Reproductive/Obstetrics negative OB ROS                             Anesthesia Physical Anesthesia Plan  ASA: II  Anesthesia Plan: MAC   Post-op Pain Management:    Induction: Intravenous  PONV Risk Score and Plan: 0  Airway Management Planned: Simple Face Mask  Additional Equipment:   Intra-op Plan:   Post-operative Plan:   Informed Consent: I have reviewed the patients History and Physical, chart, labs and discussed the procedure including the risks, benefits and alternatives for the proposed anesthesia with the patient or authorized representative who has indicated his/her understanding and acceptance.   Dental advisory given  Plan Discussed with: CRNA and Surgeon  Anesthesia Plan Comments:         Anesthesia Quick Evaluation  

## 2020-07-28 NOTE — Progress Notes (Signed)
Abigail Austin 12:02 PM  Subjective: Patient without any GI complaints and no new medical problems since she was seen by our nurse and her previous procedures were reviewed she has no other complaints and we discussed her prep   Objective: Vital signs stable afebrile no acute distress exam please see preassessment evaluation  Assessment: History of colon polyps due for colon screening  Plan: Okay to proceed with colonoscopy with anesthesia assistance  [facility]  office [phone] After 5PM or if no answer call [phone]

## 2020-07-28 NOTE — Discharge Instructions (Signed)
YOU HAD AN ENDOSCOPIC PROCEDURE TODAY: Refer to the procedure report and other information in the discharge instructions given to you for any specific questions about what was found during the examination. If this information does not answer your questions, please call [facility] office at [phone] to clarify.   YOU SHOULD EXPECT: Some feelings of bloating in the abdomen. Passage of more gas than usual. Walking can help get rid of the air that was put into your GI tract during the procedure and reduce the bloating. If you had a lower endoscopy (such as a colonoscopy or flexible sigmoidoscopy) you may notice spotting of blood in your stool or on the toilet paper. Some abdominal soreness may be present for a day or two, also.  DIET: Your first meal following the procedure should be a light meal and then it is ok to progress to your normal diet. A half-sandwich or bowl of soup is an example of a good first meal. Heavy or fried foods are harder to digest and may make you feel nauseous or bloated. Drink plenty of fluids but you should avoid alcoholic beverages for 24 hours. If you had a esophageal dilation, please see attached instructions for diet.    ACTIVITY: Your care partner should take you home directly after the procedure. You should plan to take it easy, moving slowly for the rest of the day. You can resume normal activity the day after the procedure however YOU SHOULD NOT DRIVE, use power tools, machinery or perform tasks that involve climbing or major physical exertion for 24 hours (because of the sedation medicines used during the test).   SYMPTOMS TO REPORT IMMEDIATELY: A gastroenterologist can be reached at any hour. Please call [phone]  for any of the following symptoms:  . Following lower endoscopy (colonoscopy, flexible sigmoidoscopy) Excessive amounts of blood in the stool  Significant tenderness, worsening of abdominal pains  Swelling of the abdomen that is new, acute  Fever of 100  or higher   FOLLOW UP:  If any biopsies were taken you will be contacted by phone or by letter within the next 1-3 weeks. Call [phone]  if you have not heard about the biopsies in 3 weeks.  Please also call with any specific questions about appointments or follow up tests. Call if question or problem and follow-up as needed otherwise recheck colonic screening in 10 years

## 2020-07-28 NOTE — Op Note (Signed)
[facility] Patient Name: Abigail Austin Procedure Date: 07/28/2020 MRN: [id number] Attending MD: Vida Rigger , MD Date of Birth: 28-Apr-1951 CSN: [id number] Age: 69 Admit Type: Outpatient Procedure:                Colonoscopy Indications:              High risk colon cancer surveillance: Personal                            history of colonic polyps, Last colonoscopy:                            December 2014 Providers:                Vida Rigger, MD, Dwain Sarna, RN, Lawson Radar,                            Technician, Dionne Bucy, CRNA Referring MD:              Medicines:                Propofol total dose 440 mg IV Complications:            No immediate complications. Estimated Blood Loss:     Estimated blood loss: none. Procedure:                Pre-Anesthesia Assessment:                           - Prior to the procedure, a History and Physical                            was performed, and patient medications and                            allergies were reviewed. The patient's tolerance of                            previous anesthesia was also reviewed. The risks                            and benefits of the procedure and the sedation                            options and risks were discussed with the patient.                            All questions were answered, and informed consent                            was obtained. Prior Anticoagulants: The patient has                            taken no previous anticoagulant or antiplatelet                            agents.  ASA Grade Assessment: II - A patient with                            mild systemic disease. After reviewing the risks                            and benefits, the patient was deemed in                            satisfactory condition to undergo the procedure.                           After obtaining informed consent, the colonoscope                            was passed under direct  vision. Throughout the                            procedure, the patient's blood pressure, pulse, and                            oxygen saturations were monitored continuously. The                            [id number]-[id number] ([id number] Olympus pediatric colonscope                            was introduced through the anus and advanced to the                            the terminal ileum. The terminal ileum, ileocecal                            valve, appendiceal orifice, and rectum were                            photographed. The colonoscopy was performed without                            difficulty. The patient tolerated the procedure                            well. The quality of the bowel preparation was                            adequate. abdominal pressure was applied Scope In: 12:39:59 PM Scope Out: 1:04:16 PM Scope Withdrawal Time: 0 hours 17 minutes 43 seconds  Total Procedure Duration: 0 hours 24 minutes 17 seconds  Findings:      External and internal hemorrhoids were found during retroflexion, during       perianal exam and during digital exam. The hemorrhoids were small.      Scattered small-mouthed diverticula were found in the sigmoid colon,       descending colon and distal descending colon.  The terminal ileum appeared normal.      The exam was otherwise without abnormality. Impression:               - External and internal hemorrhoids.                           - Diverticulosis in the sigmoid colon, in the                            descending colon and in the distal descending colon.                           - The examined portion of the ileum was normal.                           - The examination was otherwise normal.                           - No specimens collected. Moderate Sedation:      Not Applicable - Patient had care per Anesthesia. Recommendation:           - Patient has a contact number available for                            emergencies. The signs and  symptoms of potential                            delayed complications were discussed with the                            patient. Return to normal activities tomorrow.                            Written discharge instructions were provided to the                            patient.                           - Soft diet today.                           - Continue present medications.                           - Repeat colonoscopy in 10 years for screening                            purposes.                           - Return to GI office PRN.                           - Telephone GI clinic if symptomatic PRN. Procedure Code(s):        --- Professional ---  43154, Colonoscopy, flexible; diagnostic, including                            collection of specimen(s) by brushing or washing,                            when performed (separate procedure) Diagnosis Code(s):        --- Professional ---                           Z86.010, Personal history of colonic polyps                           K57.30, Diverticulosis of large intestine without                            perforation or abscess without bleeding CPT copyright 2019 [employer]. All rights reserved. The codes documented in this report are preliminary and upon coder review may  be revised to meet current compliance requirements. Vida Rigger, MD 07/28/2020 1:17:32 PM This report has been signed electronically. Number of Addenda: 0

## 2020-07-29 ENCOUNTER — Encounter (HOSPITAL_COMMUNITY): Payer: Self-pay | Admitting: Gastroenterology

## 2020-08-08 NOTE — Anesthesia Postprocedure Evaluation (Signed)
Anesthesia Post Note  Patient: Abigail Austin  Procedure(s) Performed: COLONOSCOPY WITH PROPOFOL (N/A )     Patient location during evaluation: PACU Anesthesia Type: MAC Level of consciousness: awake and alert Pain management: pain level controlled Vital Signs Assessment: post-procedure vital signs reviewed and stable Respiratory status: spontaneous breathing, nonlabored ventilation, respiratory function stable and patient connected to nasal cannula oxygen Cardiovascular status: stable and blood pressure returned to baseline Postop Assessment: no apparent nausea or vomiting Anesthetic complications: no   No complications documented.  Last Vitals:  Vitals:   07/28/20 1310 07/28/20 1320  BP: 122/89 (!) 116/43  Pulse: 67 (!) 59  Resp: (!) 21 20  Temp: 37.1 C   SpO2: 96% 97%    Last Pain:  Vitals:   07/29/20 1343  TempSrc:   PainSc: 0-No pain                 Manasvi Dickard S

## 2020-09-23 DIAGNOSIS — Z20822 Contact with and (suspected) exposure to covid-19: Secondary | ICD-10-CM | POA: Diagnosis not present

## 2020-10-22 ENCOUNTER — Encounter: Payer: BC Managed Care – PPO | Admitting: Obstetrics and Gynecology

## 2020-10-29 ENCOUNTER — Other Ambulatory Visit: Payer: Self-pay | Admitting: Family Medicine

## 2020-10-29 DIAGNOSIS — Z1231 Encounter for screening mammogram for malignant neoplasm of breast: Secondary | ICD-10-CM

## 2020-11-29 ENCOUNTER — Other Ambulatory Visit: Payer: Self-pay

## 2020-11-29 ENCOUNTER — Ambulatory Visit
Admission: RE | Admit: 2020-11-29 | Discharge: 2020-11-29 | Disposition: A | Discharge status: Home / Self Care | Payer: BC Managed Care – PPO | Source: Ambulatory Visit

## 2020-11-29 DIAGNOSIS — Z1231 Encounter for screening mammogram for malignant neoplasm of breast: Secondary | ICD-10-CM

## 2020-11-30 DIAGNOSIS — Z79899 Other long term (current) drug therapy: Secondary | ICD-10-CM | POA: Diagnosis not present

## 2020-11-30 DIAGNOSIS — E7800 Pure hypercholesterolemia, unspecified: Secondary | ICD-10-CM | POA: Diagnosis not present

## 2020-11-30 DIAGNOSIS — Z0000 Encounter for general adult medical examination without abnormal findings: Secondary | ICD-10-CM | POA: Diagnosis not present

## 2020-12-07 ENCOUNTER — Telehealth: Payer: Self-pay | Admitting: *Deleted

## 2020-12-07 NOTE — Telephone Encounter (Signed)
Patient called requesting refill on Valtrex 500 mg tablet, overdue for annual exam. Will have appointments called to schedule annual and Rx can be refilled.

## 2020-12-08 MED ORDER — VALACYCLOVIR HCL 500 MG PO TABS: 500.0000 mg | ORAL_TABLET | Freq: Two times a day (BID) | ORAL | 1 refills | Status: AC

## 2020-12-08 NOTE — Telephone Encounter (Signed)
Pt has appt 02/08/21 with ML.

## 2021-02-08 ENCOUNTER — Ambulatory Visit: Payer: BC Managed Care – PPO | Admitting: Obstetrics & Gynecology

## 2021-02-23 DIAGNOSIS — H25813 Combined forms of age-related cataract, bilateral: Secondary | ICD-10-CM | POA: Diagnosis not present

## 2021-03-31 DIAGNOSIS — F411 Generalized anxiety disorder: Secondary | ICD-10-CM | POA: Diagnosis not present

## 2021-03-31 DIAGNOSIS — U071 COVID-19: Secondary | ICD-10-CM | POA: Diagnosis not present

## 2021-05-19 DIAGNOSIS — H25813 Combined forms of age-related cataract, bilateral: Secondary | ICD-10-CM | POA: Diagnosis not present

## 2021-07-01 DIAGNOSIS — H25812 Combined forms of age-related cataract, left eye: Secondary | ICD-10-CM | POA: Diagnosis not present

## 2021-07-21 DIAGNOSIS — Z01419 Encounter for gynecological examination (general) (routine) without abnormal findings: Secondary | ICD-10-CM | POA: Diagnosis not present

## 2021-07-21 DIAGNOSIS — N952 Postmenopausal atrophic vaginitis: Secondary | ICD-10-CM | POA: Diagnosis not present

## 2021-07-21 DIAGNOSIS — Z6830 Body mass index (BMI) 30.0-30.9, adult: Secondary | ICD-10-CM | POA: Diagnosis not present

## 2021-12-28 DIAGNOSIS — R5383 Other fatigue: Secondary | ICD-10-CM | POA: Diagnosis not present

## 2021-12-28 DIAGNOSIS — J9801 Acute bronchospasm: Secondary | ICD-10-CM | POA: Diagnosis not present

## 2021-12-28 DIAGNOSIS — Z0000 Encounter for general adult medical examination without abnormal findings: Secondary | ICD-10-CM | POA: Diagnosis not present

## 2021-12-28 DIAGNOSIS — E7800 Pure hypercholesterolemia, unspecified: Secondary | ICD-10-CM | POA: Diagnosis not present

## 2022-01-30 DIAGNOSIS — G4733 Obstructive sleep apnea (adult) (pediatric): Secondary | ICD-10-CM | POA: Diagnosis not present

## 2022-02-24 DIAGNOSIS — G4733 Obstructive sleep apnea (adult) (pediatric): Secondary | ICD-10-CM | POA: Diagnosis not present

## 2022-03-13 DIAGNOSIS — G4733 Obstructive sleep apnea (adult) (pediatric): Secondary | ICD-10-CM | POA: Diagnosis not present

## 2022-03-26 DIAGNOSIS — G4733 Obstructive sleep apnea (adult) (pediatric): Secondary | ICD-10-CM | POA: Diagnosis not present

## 2022-04-26 DIAGNOSIS — G4733 Obstructive sleep apnea (adult) (pediatric): Secondary | ICD-10-CM | POA: Diagnosis not present

## 2022-05-03 DIAGNOSIS — G4733 Obstructive sleep apnea (adult) (pediatric): Secondary | ICD-10-CM | POA: Diagnosis not present

## 2022-05-27 DIAGNOSIS — G4733 Obstructive sleep apnea (adult) (pediatric): Secondary | ICD-10-CM | POA: Diagnosis not present

## 2022-07-27 DIAGNOSIS — Z6830 Body mass index (BMI) 30.0-30.9, adult: Secondary | ICD-10-CM | POA: Diagnosis not present

## 2022-07-27 DIAGNOSIS — Z01419 Encounter for gynecological examination (general) (routine) without abnormal findings: Secondary | ICD-10-CM | POA: Diagnosis not present

## 2022-07-27 DIAGNOSIS — Z124 Encounter for screening for malignant neoplasm of cervix: Secondary | ICD-10-CM | POA: Diagnosis not present

## 2022-10-12 DIAGNOSIS — Z13820 Encounter for screening for osteoporosis: Secondary | ICD-10-CM | POA: Diagnosis not present

## 2022-10-12 DIAGNOSIS — Z1231 Encounter for screening mammogram for malignant neoplasm of breast: Secondary | ICD-10-CM | POA: Diagnosis not present

## 2022-10-18 DIAGNOSIS — H0233 Blepharochalasis right eye, unspecified eyelid: Secondary | ICD-10-CM | POA: Diagnosis not present

## 2022-10-18 DIAGNOSIS — H25811 Combined forms of age-related cataract, right eye: Secondary | ICD-10-CM | POA: Diagnosis not present

## 2022-10-18 DIAGNOSIS — H02403 Unspecified ptosis of bilateral eyelids: Secondary | ICD-10-CM | POA: Diagnosis not present

## 2022-10-18 DIAGNOSIS — H04123 Dry eye syndrome of bilateral lacrimal glands: Secondary | ICD-10-CM | POA: Diagnosis not present

## 2023-01-04 DIAGNOSIS — Z79899 Other long term (current) drug therapy: Secondary | ICD-10-CM | POA: Diagnosis not present

## 2023-01-04 DIAGNOSIS — Z0000 Encounter for general adult medical examination without abnormal findings: Secondary | ICD-10-CM | POA: Diagnosis not present

## 2023-01-04 DIAGNOSIS — E7800 Pure hypercholesterolemia, unspecified: Secondary | ICD-10-CM | POA: Diagnosis not present
